# Patient Record
Sex: Male | Born: 1940 | Hispanic: No | Marital: Single | Smoking: Former smoker
Health system: Southern US, Community
[De-identification: ages and names within clinical notes are randomized; demographics above are authoritative.]

## PROBLEM LIST (undated history)

## (undated) DIAGNOSIS — I639 Cerebral infarction, unspecified: Secondary | ICD-10-CM

## (undated) DIAGNOSIS — C9110 Chronic lymphocytic leukemia of B-cell type not having achieved remission: Secondary | ICD-10-CM

## (undated) HISTORY — DX: Chronic lymphocytic leukemia of B-cell type not having achieved remission: C91.10

---

## 2004-10-02 ENCOUNTER — Ambulatory Visit: Payer: Self-pay | Admitting: Oncology

## 2005-02-09 ENCOUNTER — Ambulatory Visit: Payer: Self-pay | Admitting: Oncology

## 2005-07-30 ENCOUNTER — Ambulatory Visit: Payer: Self-pay | Admitting: Oncology

## 2006-01-23 ENCOUNTER — Ambulatory Visit: Payer: Self-pay | Admitting: Oncology

## 2006-02-12 LAB — CBC WITH DIFFERENTIAL/PLATELET
Basophils Absolute: 0.2 10*3/uL — ABNORMAL HIGH (ref 0.0–0.1)
EOS%: 0.4 % (ref 0.0–7.0)
Eosinophils Absolute: 0.5 10*3/uL (ref 0.0–0.5)
HGB: 13.1 g/dL (ref 13.0–17.1)
MCH: 28.9 pg (ref 28.0–33.4)
NEUT#: 3.9 10*3/uL (ref 1.5–6.5)
RBC: 4.53 10*6/uL (ref 4.20–5.71)
RDW: 14.8 % — ABNORMAL HIGH (ref 11.2–14.6)
lymph#: 101.2 10*3/uL — ABNORMAL HIGH (ref 0.9–3.3)

## 2006-08-08 ENCOUNTER — Ambulatory Visit: Payer: Self-pay | Admitting: Oncology

## 2006-09-25 ENCOUNTER — Ambulatory Visit: Payer: Self-pay | Admitting: Oncology

## 2006-09-26 LAB — CBC WITH DIFFERENTIAL/PLATELET
BASO%: 0.1 % (ref 0.0–2.0)
EOS%: 0.2 % (ref 0.0–7.0)
HCT: 42.4 % (ref 38.7–49.9)
MCH: 29.4 pg (ref 28.0–33.4)
MCHC: 33.6 g/dL (ref 32.0–35.9)
MCV: 87.5 fL (ref 81.6–98.0)
MONO%: 2.1 % (ref 0.0–13.0)
NEUT%: 5 % — ABNORMAL LOW (ref 40.0–75.0)
RDW: 14.6 % (ref 11.2–14.6)
lymph#: 93.9 10*3/uL — ABNORMAL HIGH (ref 0.9–3.3)

## 2007-03-21 ENCOUNTER — Ambulatory Visit: Payer: Self-pay | Admitting: Oncology

## 2007-03-25 LAB — CBC WITH DIFFERENTIAL/PLATELET
BASO%: 0.2 % (ref 0.0–2.0)
Basophils Absolute: 0.2 10*3/uL — ABNORMAL HIGH (ref 0.0–0.1)
EOS%: 0.6 % (ref 0.0–7.0)
HGB: 14 g/dL (ref 13.0–17.1)
MCH: 29 pg (ref 28.0–33.4)
MONO%: 2.1 % (ref 0.0–13.0)
RBC: 4.82 10*6/uL (ref 4.20–5.71)
RDW: 15 % — ABNORMAL HIGH (ref 11.2–14.6)
lymph#: 87.6 10*3/uL — ABNORMAL HIGH (ref 0.9–3.3)

## 2007-03-25 LAB — COMPREHENSIVE METABOLIC PANEL
ALT: 19 U/L (ref 0–53)
AST: 16 U/L (ref 0–37)
Alkaline Phosphatase: 77 U/L (ref 39–117)
BUN: 22 mg/dL (ref 6–23)
Chloride: 103 mEq/L (ref 96–112)
Creatinine, Ser: 0.81 mg/dL (ref 0.40–1.50)
Total Bilirubin: 0.8 mg/dL (ref 0.3–1.2)

## 2007-03-25 LAB — RETICULOCYTES: RBC: UNDETERMINED 10*6/uL (ref 4.20–5.71)

## 2007-03-25 LAB — LIPID PANEL
HDL: 25 mg/dL — ABNORMAL LOW (ref >39)
Triglycerides: 204 mg/dL — ABNORMAL HIGH (ref <150)

## 2007-03-25 LAB — RETICULOCYTES (CHCC): Retic Ct Pct: 0.7 % (ref 0.4–3.1)

## 2007-03-25 LAB — PSA, MEDICARE: PSA: 0.64 ng/mL (ref 0.10–4.00)

## 2007-03-25 LAB — TSH: TSH: 2.467 u[IU]/mL (ref 0.350–5.500)

## 2007-09-30 ENCOUNTER — Ambulatory Visit: Payer: Self-pay | Admitting: Oncology

## 2008-03-30 ENCOUNTER — Ambulatory Visit: Payer: Self-pay | Admitting: Oncology

## 2008-04-01 LAB — CBC WITH DIFFERENTIAL/PLATELET
BASO%: 0.2 % (ref 0.0–2.0)
EOS%: 0.3 % (ref 0.0–7.0)
MCHC: 32.5 g/dL (ref 32.0–35.9)
MONO#: 0.8 10*3/uL (ref 0.1–0.9)
RBC: 4.76 10*6/uL (ref 4.20–5.71)
WBC: 71.7 10*3/uL (ref 4.0–10.0)
lymph#: 67.4 10*3/uL — ABNORMAL HIGH (ref 0.9–3.3)

## 2008-04-01 LAB — TECHNOLOGIST REVIEW

## 2008-09-24 ENCOUNTER — Ambulatory Visit: Payer: Self-pay | Admitting: Oncology

## 2008-09-28 LAB — CBC WITH DIFFERENTIAL/PLATELET
BASO%: 0.2 % (ref 0.0–2.0)
EOS%: 0.4 % (ref 0.0–7.0)
MCH: 28.8 pg (ref 27.2–33.4)
MCHC: 32.5 g/dL (ref 32.0–36.0)
MCV: 88.3 fL (ref 79.3–98.0)
MONO%: 0.9 % (ref 0.0–14.0)
NEUT#: 2.2 10*3/uL (ref 1.5–6.5)
RBC: 4.8 10*6/uL (ref 4.20–5.82)
RDW: 14.6 % (ref 11.0–14.6)
nRBC: 0 % (ref 0–0)

## 2009-03-31 ENCOUNTER — Ambulatory Visit: Payer: Self-pay | Admitting: Oncology

## 2009-04-05 LAB — CBC WITH DIFFERENTIAL/PLATELET
Basophils Absolute: 0.1 10*3/uL (ref 0.0–0.1)
EOS%: 0.3 % (ref 0.0–7.0)
HGB: 13.9 g/dL (ref 13.0–17.1)
LYMPH%: 93.1 % — ABNORMAL HIGH (ref 14.0–49.0)
MCH: 29.1 pg (ref 27.2–33.4)
MCV: 87.7 fL (ref 79.3–98.0)
MONO%: 1.8 % (ref 0.0–14.0)
RDW: 14.7 % — ABNORMAL HIGH (ref 11.0–14.6)

## 2009-10-18 ENCOUNTER — Ambulatory Visit: Payer: Self-pay | Admitting: Oncology

## 2009-10-20 LAB — CBC WITH DIFFERENTIAL/PLATELET
Basophils Absolute: 0 10*3/uL (ref 0.0–0.1)
EOS%: 0.4 % (ref 0.0–7.0)
HCT: 38.8 % (ref 38.4–49.9)
HGB: 13.1 g/dL (ref 13.0–17.1)
MCH: 31.1 pg (ref 27.2–33.4)
MCV: 92.3 fL (ref 79.3–98.0)
MONO%: 6.8 % (ref 0.0–14.0)
NEUT%: 5.4 % — ABNORMAL LOW (ref 39.0–75.0)
lymph#: 40.2 10*3/uL — ABNORMAL HIGH (ref 0.9–3.3)

## 2010-06-29 ENCOUNTER — Ambulatory Visit: Payer: Self-pay | Admitting: Oncology

## 2011-03-05 ENCOUNTER — Encounter (HOSPITAL_BASED_OUTPATIENT_CLINIC_OR_DEPARTMENT_OTHER): Payer: Medicare Other | Admitting: Oncology

## 2011-03-05 ENCOUNTER — Other Ambulatory Visit: Payer: Self-pay | Admitting: Oncology

## 2011-03-05 DIAGNOSIS — Z23 Encounter for immunization: Secondary | ICD-10-CM

## 2011-03-05 DIAGNOSIS — C9110 Chronic lymphocytic leukemia of B-cell type not having achieved remission: Secondary | ICD-10-CM

## 2011-03-05 LAB — CBC WITH DIFFERENTIAL/PLATELET
Eosinophils Absolute: 0.2 10*3/uL (ref 0.0–0.5)
LYMPH%: 94.6 % — ABNORMAL HIGH (ref 14.0–49.0)
MONO#: 0.5 10*3/uL (ref 0.1–0.9)
NEUT#: 2.1 10*3/uL (ref 1.5–6.5)
Platelets: 84 10*3/uL — ABNORMAL LOW (ref 140–400)
RBC: 4.6 10*6/uL (ref 4.20–5.82)
RDW: 14.3 % (ref 11.0–14.6)
WBC: 54.7 10*3/uL (ref 4.0–10.3)

## 2011-03-05 LAB — TECHNOLOGIST REVIEW

## 2011-06-07 ENCOUNTER — Telehealth: Payer: Self-pay | Admitting: *Deleted

## 2011-06-07 NOTE — Telephone Encounter (Signed)
Pt reports he was instructed to begin Aspirin for "cardiac blockage". Was also given blood pressure medicine. Has not started taking medications yet, wanted to be sure this is OK with Dr. Truett Perna. 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 Being scheduled for cardiac cath. He reports cardiologist at [facility] is aware of his platelet count.

## 2011-06-07 NOTE — Telephone Encounter (Signed)
Ok to take aspirin

## 2011-06-08 ENCOUNTER — Telehealth: Payer: Self-pay | Admitting: *Deleted

## 2011-06-08 NOTE — Telephone Encounter (Signed)
Per Dr. Truett Perna, OK to begin aspirin therapy under direction of cardiologist. Called pt, he verbalized understanding. Informed him that schedulers will call him with his yearly follow up appt due in August.

## 2011-07-10 HISTORY — PX: VEIN BYPASS SURGERY: SHX833

## 2011-10-05 ENCOUNTER — Telehealth: Payer: Self-pay | Admitting: Oncology

## 2011-10-05 NOTE — Telephone Encounter (Signed)
called pt and provided appts for aug2013 to wife

## 2012-02-08 ENCOUNTER — Telehealth: Payer: Self-pay | Admitting: Oncology

## 2012-02-08 NOTE — Telephone Encounter (Signed)
Returned pt's call and r/s 8/29 appt to 9/23. Date per pt.

## 2012-03-06 ENCOUNTER — Ambulatory Visit: Payer: Medicare Other | Admitting: Oncology

## 2012-03-06 ENCOUNTER — Other Ambulatory Visit: Payer: Medicare Other

## 2012-03-28 ENCOUNTER — Encounter: Payer: Self-pay | Admitting: *Deleted

## 2012-03-31 ENCOUNTER — Telehealth: Payer: Self-pay | Admitting: Oncology

## 2012-03-31 ENCOUNTER — Ambulatory Visit (HOSPITAL_BASED_OUTPATIENT_CLINIC_OR_DEPARTMENT_OTHER): Payer: Medicare Other | Admitting: Oncology

## 2012-03-31 ENCOUNTER — Other Ambulatory Visit (HOSPITAL_BASED_OUTPATIENT_CLINIC_OR_DEPARTMENT_OTHER): Payer: Medicare Other | Admitting: Lab

## 2012-03-31 VITALS — BP 147/81 | HR 61 | Temp 97.7°F | Resp 18 | Ht 69.0 in | Wt 182.1 lb

## 2012-03-31 DIAGNOSIS — C9110 Chronic lymphocytic leukemia of B-cell type not having achieved remission: Secondary | ICD-10-CM

## 2012-03-31 DIAGNOSIS — E119 Type 2 diabetes mellitus without complications: Secondary | ICD-10-CM

## 2012-03-31 DIAGNOSIS — D649 Anemia, unspecified: Secondary | ICD-10-CM

## 2012-03-31 DIAGNOSIS — Z951 Presence of aortocoronary bypass graft: Secondary | ICD-10-CM

## 2012-03-31 LAB — CBC WITH DIFFERENTIAL/PLATELET
BASO%: 0.2 % (ref 0.0–2.0)
EOS%: 0.6 % (ref 0.0–7.0)
HCT: 38.7 % (ref 38.4–49.9)
LYMPH%: 91.3 % — ABNORMAL HIGH (ref 14.0–49.0)
MCH: 30.5 pg (ref 27.2–33.4)
MCHC: 32.8 g/dL (ref 32.0–36.0)
MONO%: 1.8 % (ref 0.0–14.0)
NEUT%: 6.1 % — ABNORMAL LOW (ref 39.0–75.0)
Platelets: 73 10*3/uL — ABNORMAL LOW (ref 140–400)

## 2012-03-31 LAB — TECHNOLOGIST REVIEW

## 2012-03-31 NOTE — Progress Notes (Signed)
   [facility]    OFFICE PROGRESS NOTE   INTERVAL HISTORY:   He returns as scheduled. He had a coronary artery bypass surgery in March of this year. He feels well. No fever, night sweats, or palpable lymph nodes. No complaint.  Objective:  Vital signs in last 24 hours:  Blood pressure 147/81, pulse 61, temperature 97.7 F (36.5 C), temperature source Oral, resp. rate 18, height 5\' 9"  (1.753 m), weight 182 lb 1.6 oz (82.6 kg).    HEENT: Neck without mass Lymphatics: No cervical, supraclavicular, axillary, or inguinal nodes Resp: Lungs clear bilateral Cardio: Regular rate and rhythm GI: No hepatosplenomegaly Vascular: No leg edema   Lab Results:  Lab Results  Component Value Date   WBC 37.8* 03/31/2012   HGB 12.7* 03/31/2012   HCT 38.7 03/31/2012   MCV 92.8 03/31/2012   PLT 73* 03/31/2012   ANC 2.4   Medications: I have reviewed the patient's current medications.  Assessment/Plan: 1. Chronic lymphocytic leukemia, mild anemia, otherwise stable from a hematologic standpoint and asymptomatic.  The plan is to continue an observation approach. 2. Diabetes. 3.     Coronary artery bypass surgery in March of 2013   Disposition:  He remains asymptomatic from the CLL. No indication for treatment at present. He will return for a followup CBC in 3 months. He is scheduled for a one-year of his visit. He last received a pneumococcal vaccine in September of 2010. He will stay up-to-date on the influenza vaccine.   Thornton Papas, MD  03/31/2012  1:47 PM

## 2012-03-31 NOTE — Telephone Encounter (Signed)
Gave pt appt for December 2013 lab only then see MD in September 2014 with lab

## 2012-07-04 ENCOUNTER — Other Ambulatory Visit (HOSPITAL_BASED_OUTPATIENT_CLINIC_OR_DEPARTMENT_OTHER): Payer: Medicare Other | Admitting: Lab

## 2012-07-04 DIAGNOSIS — C9110 Chronic lymphocytic leukemia of B-cell type not having achieved remission: Secondary | ICD-10-CM

## 2012-07-04 LAB — TECHNOLOGIST REVIEW

## 2012-07-04 LAB — CBC WITH DIFFERENTIAL/PLATELET
BASO%: 0.1 % (ref 0.0–2.0)
Basophils Absolute: 0 10*3/uL (ref 0.0–0.1)
EOS%: 0.8 % (ref 0.0–7.0)
HGB: 12.3 g/dL — ABNORMAL LOW (ref 13.0–17.1)
MCH: 29.7 pg (ref 27.2–33.4)
MONO%: 2.2 % (ref 0.0–14.0)
RBC: 4.14 10*6/uL — ABNORMAL LOW (ref 4.20–5.82)
RDW: 13.6 % (ref 11.0–14.6)
lymph#: 36.1 10*3/uL — ABNORMAL HIGH (ref 0.9–3.3)
nRBC: 0 % (ref 0–0)

## 2012-07-05 ENCOUNTER — Telehealth: Payer: Self-pay | Admitting: *Deleted

## 2012-07-05 NOTE — Telephone Encounter (Signed)
Left message at home number to have pt call office for lab results. (Hgb and PLT stable, per Dr. Truett Perna. Follow up as scheduled.

## 2012-07-05 NOTE — Telephone Encounter (Signed)
Message copied by Caleb Popp on Sat Jul 05, 2012  2:23 PM ------      Message from: Ladene Artist      Created: Sat Jul 05, 2012  9:00 AM       Please call patientk hb and plts are stable, f/u as scheduled

## 2013-04-07 ENCOUNTER — Other Ambulatory Visit: Payer: Self-pay | Admitting: *Deleted

## 2013-04-07 DIAGNOSIS — C9110 Chronic lymphocytic leukemia of B-cell type not having achieved remission: Secondary | ICD-10-CM

## 2013-04-08 ENCOUNTER — Other Ambulatory Visit: Payer: Medicare Other | Admitting: Lab

## 2013-04-09 ENCOUNTER — Telehealth: Payer: Self-pay | Admitting: Oncology

## 2013-04-09 ENCOUNTER — Ambulatory Visit (HOSPITAL_BASED_OUTPATIENT_CLINIC_OR_DEPARTMENT_OTHER): Payer: Medicare Other | Admitting: Oncology

## 2013-04-09 ENCOUNTER — Other Ambulatory Visit (HOSPITAL_BASED_OUTPATIENT_CLINIC_OR_DEPARTMENT_OTHER): Payer: Medicare Other | Admitting: Lab

## 2013-04-09 VITALS — BP 128/65 | HR 67 | Temp 97.0°F | Resp 18 | Ht 69.0 in | Wt 171.0 lb

## 2013-04-09 DIAGNOSIS — C9210 Chronic myeloid leukemia, BCR/ABL-positive, not having achieved remission: Secondary | ICD-10-CM

## 2013-04-09 DIAGNOSIS — L0591 Pilonidal cyst without abscess: Secondary | ICD-10-CM

## 2013-04-09 DIAGNOSIS — C9110 Chronic lymphocytic leukemia of B-cell type not having achieved remission: Secondary | ICD-10-CM

## 2013-04-09 LAB — CBC WITH DIFFERENTIAL/PLATELET
BASO%: 0.1 % (ref 0.0–2.0)
EOS%: 0.5 % (ref 0.0–7.0)
MCH: 28.9 pg (ref 27.2–33.4)
MCHC: 33 g/dL (ref 32.0–36.0)
MONO#: 1 10*3/uL — ABNORMAL HIGH (ref 0.1–0.9)
RBC: 4.53 10*6/uL (ref 4.20–5.82)
RDW: 13.9 % (ref 11.0–14.6)
WBC: 42.7 10*3/uL — ABNORMAL HIGH (ref 4.0–10.3)
lymph#: 38.7 10*3/uL — ABNORMAL HIGH (ref 0.9–3.3)

## 2013-04-09 LAB — TECHNOLOGIST REVIEW

## 2013-04-09 MED ORDER — MUPIROCIN 2 % EX OINT: TOPICAL_OINTMENT | Freq: Two times a day (BID) | CUTANEOUS | Status: DC

## 2013-04-09 NOTE — Progress Notes (Signed)
    [facility]    OFFICE PROGRESS NOTE   INTERVAL HISTORY:   He returns for scheduled followup of chronic lymphocytic leukemia. No fever or night sweats. Good appetite. He is walking daily.  He reports a skin infection at the upper gluteal fold that has improved with "Bactroban "appointment. He has been taking this for the past several days and requests a refill.  Objective:  Vital signs in last 24 hours:  Blood pressure 128/65, pulse 67, temperature 97 F (36.1 C), temperature source Oral, resp. rate 18, height 5\' 9"  (1.753 m), weight 171 lb (77.565 kg).    HEENT: Neck without mass Lymphatics: No cervical, supraclavicular, or inguinal nodes. Prominent bilateral axillary fat pads,? 1 cm right axillary node Resp: Lungs clear bilaterally Cardio: Regular rate and rhythm GI: No hepatosplenomegaly Vascular: No leg edema  Skin: At the upper gluteal fold there is an area erythema and mild induration to the left of midline. No fluctuance or drainage.     Lab Results:  Lab Results  Component Value Date   WBC 42.7* 04/09/2013   HGB 13.1 04/09/2013   HCT 39.7 04/09/2013   MCV 87.7 04/09/2013   PLT 91* 04/09/2013   ANC 2.7    Medications: I have reviewed the patient's current medications.  Assessment/Plan: 1. Chronic lymphocytic leukemia, mild anemia/thrombocytopenia, stable from a hematologic standpoint and asymptomatic. The plan is to continue an observation approach. 2. Diabetes.       3. Coronary artery bypass surgery in March of 2013        4. 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 We refilled Bactroban to take for the next week.. I recommended he seek medical attention if this lesion does not resolve.   Disposition:  He remains asymptomatic from the CLL. The plan is to continue an observation approach. He will obtain an influenza vaccine at home. He will be due for a pneumococcal vaccine in 2015. He last  received a pneumococcal vaccine in September of 2010.  Mr. Eidem will return for an office visit in one year.   Thornton Papas, MD  04/09/2013  1:43 PM

## 2013-04-09 NOTE — Telephone Encounter (Signed)
gv and printed appt sched and avs for pt for OCT 2015  °

## 2014-03-22 ENCOUNTER — Telehealth: Payer: Self-pay | Admitting: Oncology

## 2014-03-22 NOTE — Telephone Encounter (Signed)
s/w pt re new appt for 10/8 - moved from 10/2.

## 2014-04-09 ENCOUNTER — Other Ambulatory Visit: Payer: Medicare Other

## 2014-04-09 ENCOUNTER — Ambulatory Visit: Payer: Medicare Other | Admitting: Oncology

## 2014-04-14 ENCOUNTER — Other Ambulatory Visit: Payer: Self-pay | Admitting: *Deleted

## 2014-04-14 DIAGNOSIS — C9210 Chronic myeloid leukemia, BCR/ABL-positive, not having achieved remission: Secondary | ICD-10-CM

## 2014-04-15 ENCOUNTER — Other Ambulatory Visit (HOSPITAL_BASED_OUTPATIENT_CLINIC_OR_DEPARTMENT_OTHER): Payer: Medicare Other

## 2014-04-15 ENCOUNTER — Ambulatory Visit (HOSPITAL_BASED_OUTPATIENT_CLINIC_OR_DEPARTMENT_OTHER): Payer: Medicare Other | Admitting: Oncology

## 2014-04-15 ENCOUNTER — Encounter: Payer: Self-pay | Admitting: *Deleted

## 2014-04-15 ENCOUNTER — Telehealth: Payer: Self-pay | Admitting: Oncology

## 2014-04-15 VITALS — BP 141/71 | HR 62 | Temp 97.7°F | Resp 18 | Ht 69.0 in | Wt 169.2 lb

## 2014-04-15 DIAGNOSIS — C9110 Chronic lymphocytic leukemia of B-cell type not having achieved remission: Secondary | ICD-10-CM | POA: Insufficient documentation

## 2014-04-15 DIAGNOSIS — D649 Anemia, unspecified: Secondary | ICD-10-CM

## 2014-04-15 DIAGNOSIS — D696 Thrombocytopenia, unspecified: Secondary | ICD-10-CM

## 2014-04-15 DIAGNOSIS — C9210 Chronic myeloid leukemia, BCR/ABL-positive, not having achieved remission: Secondary | ICD-10-CM

## 2014-04-15 DIAGNOSIS — Z23 Encounter for immunization: Secondary | ICD-10-CM

## 2014-04-15 LAB — CBC WITH DIFFERENTIAL/PLATELET
BASO%: 0.2 % (ref 0.0–2.0)
Basophils Absolute: 0.1 10*3/uL (ref 0.0–0.1)
EOS%: 1 % (ref 0.0–7.0)
Eosinophils Absolute: 0.4 10*3/uL (ref 0.0–0.5)
HEMATOCRIT: 39.6 % (ref 38.4–49.9)
HGB: 12.4 g/dL — ABNORMAL LOW (ref 13.0–17.1)
LYMPH%: 90.3 % — AB (ref 14.0–49.0)
MCH: 27.4 pg (ref 27.2–33.4)
MCHC: 31.3 g/dL — AB (ref 32.0–36.0)
MCV: 87.6 fL (ref 79.3–98.0)
MONO#: 0.8 10*3/uL (ref 0.1–0.9)
MONO%: 1.9 % (ref 0.0–14.0)
NEUT#: 2.7 10*3/uL (ref 1.5–6.5)
NEUT%: 6.6 % — AB (ref 39.0–75.0)
PLATELETS: 106 10*3/uL — AB (ref 140–400)
RBC: 4.52 10*6/uL (ref 4.20–5.82)
RDW: 17 % — ABNORMAL HIGH (ref 11.0–14.6)
WBC: 40.8 10*3/uL — AB (ref 4.0–10.3)
lymph#: 36.9 10*3/uL — ABNORMAL HIGH (ref 0.9–3.3)

## 2014-04-15 LAB — TECHNOLOGIST REVIEW

## 2014-04-15 MED ORDER — PNEUMOCOCCAL 13-VAL CONJ VACC IM SUSP
0.5000 mL | Freq: Once | INTRAMUSCULAR | Status: AC
  Administered 2014-04-15: 0.5 mL via INTRAMUSCULAR
  Filled 2014-04-15: qty 0.5

## 2014-04-15 NOTE — Addendum Note (Signed)
Addended by: Brien Few on: 04/15/2014 12:48 PM   Modules accepted: Orders

## 2014-04-15 NOTE — Progress Notes (Signed)
This RN faxed Dr. Gearldine Shown last office note, 04/15/2014, to Dr. Gaetana Michaelis. Fax # is [phone] and office # is [phone]. Received faxed confirmation.

## 2014-04-15 NOTE — Progress Notes (Signed)
  [facility] OFFICE PROGRESS NOTE   Diagnosis: CLL  INTERVAL HISTORY:   He returns as scheduled. He feels well. No fever, night sweats, or anorexia. He reports difficulty with balance when he is walking. He had a recent urinary tract infection. No other infections. He has received an influenza vaccine.  Objective:  Vital signs in last 24 hours:  Blood pressure 141/71, pulse 62, temperature 97.7 F (36.5 C), temperature source Oral, resp. rate 18, height 5\' 9"  (1.753 m), weight 169 lb 3.2 oz (76.749 kg), SpO2 99.00%.    HEENT: Neck without mass Lymphatics: No cervical, supraclavicular, left axillary, or inguinal nodes. Prominent right axillary fat pad versus a soft 1-2 cm node Resp: Lungs clear bilaterally Cardio: Regular rate and rhythm GI: No hepatosplenomegaly Vascular: No leg edema   Lab Results:  Lab Results  Component Value Date   WBC 40.8* 04/15/2014   HGB 12.4* 04/15/2014   HCT 39.6 04/15/2014   MCV 87.6 04/15/2014   PLT 106* 04/15/2014   NEUTROABS 2.7 04/15/2014   absolute lymphocyte count 36.9   Medications: I have reviewed the patient's current medications.  Assessment/Plan: 1. Chronic lymphocytic leukemia, mild anemia/thrombocytopenia, stable from a hematologic standpoint and asymptomatic. The plan is to continue an observation approach. 2. Diabetes.       3. Coronary artery bypass surgery in March of 2013     Disposition:  He remains asymptomatic from the CLL and stable from a hematologic standpoint. No indication for treating the CLL. We administered a 13 valent pneumococcal vaccine today. Mr. Kuang will return for an office visit in one year.  Betsy Coder, MD  04/15/2014  12:40 PM

## 2014-04-15 NOTE — Telephone Encounter (Signed)
gv adn printed appt scheda nd avs for pt for NOV.Nathaniel KitchenMarland Kitchenpt wanted appt in NOV

## 2015-04-14 ENCOUNTER — Other Ambulatory Visit: Payer: Medicare Other

## 2015-04-14 ENCOUNTER — Ambulatory Visit: Payer: Medicare Other | Admitting: Oncology

## 2015-05-25 ENCOUNTER — Telehealth: Payer: Self-pay | Admitting: Oncology

## 2015-05-25 NOTE — Telephone Encounter (Signed)
Due to PAL moved 11/25 appointments to 11/23 per BS. Spoke with patient and mailed schedule.

## 2015-06-01 ENCOUNTER — Ambulatory Visit (HOSPITAL_BASED_OUTPATIENT_CLINIC_OR_DEPARTMENT_OTHER): Payer: Medicare Other | Admitting: Oncology

## 2015-06-01 ENCOUNTER — Telehealth: Payer: Self-pay | Admitting: Oncology

## 2015-06-01 ENCOUNTER — Other Ambulatory Visit: Payer: Self-pay | Admitting: *Deleted

## 2015-06-01 ENCOUNTER — Other Ambulatory Visit (HOSPITAL_BASED_OUTPATIENT_CLINIC_OR_DEPARTMENT_OTHER): Payer: Medicare Other

## 2015-06-01 VITALS — BP 156/80 | HR 71 | Temp 97.1°F | Resp 17 | Ht 69.0 in | Wt 177.2 lb

## 2015-06-01 DIAGNOSIS — Z23 Encounter for immunization: Secondary | ICD-10-CM | POA: Diagnosis not present

## 2015-06-01 DIAGNOSIS — C9110 Chronic lymphocytic leukemia of B-cell type not having achieved remission: Secondary | ICD-10-CM | POA: Diagnosis not present

## 2015-06-01 LAB — CBC WITH DIFFERENTIAL/PLATELET
BASO%: 0.1 % (ref 0.0–2.0)
BASOS ABS: 0 10*3/uL (ref 0.0–0.1)
EOS ABS: 0.2 10*3/uL (ref 0.0–0.5)
EOS%: 0.6 % (ref 0.0–7.0)
HEMATOCRIT: 43.1 % (ref 38.4–49.9)
HGB: 14.1 g/dL (ref 13.0–17.1)
LYMPH%: 92.3 % — ABNORMAL HIGH (ref 14.0–49.0)
MCH: 28.7 pg (ref 27.2–33.4)
MCHC: 32.8 g/dL (ref 32.0–36.0)
MCV: 87.5 fL (ref 79.3–98.0)
MONO#: 0.7 10*3/uL (ref 0.1–0.9)
MONO%: 1.6 % (ref 0.0–14.0)
NEUT%: 5.4 % — ABNORMAL LOW (ref 39.0–75.0)
NEUTROS ABS: 2.4 10*3/uL (ref 1.5–6.5)
Platelets: 88 10*3/uL — ABNORMAL LOW (ref 140–400)
RBC: 4.92 10*6/uL (ref 4.20–5.82)
RDW: 14.4 % (ref 11.0–14.6)
WBC: 44.2 10*3/uL — AB (ref 4.0–10.3)
lymph#: 40.8 10*3/uL — ABNORMAL HIGH (ref 0.9–3.3)

## 2015-06-01 LAB — TECHNOLOGIST REVIEW

## 2015-06-01 MED ORDER — INFLUENZA VAC SPLIT QUAD 0.5 ML IM SUSY
0.5000 mL | PREFILLED_SYRINGE | Freq: Once | INTRAMUSCULAR | Status: AC
Start: 2015-06-01 — End: 2015-06-01
  Administered 2015-06-01: 0.5 mL via INTRAMUSCULAR
  Filled 2015-06-01: qty 0.5

## 2015-06-01 MED ORDER — PNEUMOCOCCAL VAC POLYVALENT 25 MCG/0.5ML IJ INJ
0.5000 mL | INJECTION | Freq: Once | INTRAMUSCULAR | Status: AC
  Administered 2015-06-01: 0.5 mL via INTRAMUSCULAR
  Filled 2015-06-01: qty 0.5

## 2015-06-01 NOTE — Progress Notes (Signed)
  [facility] OFFICE PROGRESS NOTE   Diagnosis: CLL  INTERVAL HISTORY:   He returns as scheduled. He feels well. No recent infection. No fever or night sweats. He reports hoarseness when he smokes cigars. He reports difficulty with balance for the past year.  Objective:  Vital signs in last 24 hours:  Blood pressure 156/80, pulse 71, temperature 97.1 F (36.2 C), temperature source Oral, resp. rate 17, height 5\' 9"  (1.753 m), weight 177 lb 3.2 oz (80.377 kg), SpO2 99 %.    HEENT: Neck without mass, mild right conjunctival erythema Lymphatics: No cervical, supra-clavicular, axillary, or inguinal nodes Resp: Lungs clear bilaterally Cardio: Regular rate and rhythm GI: No hepatomegaly, the spleen tip is palpable in the left subcostal region Vascular: No leg edema   Lab Results:  Lab Results  Component Value Date   WBC 44.2* 06/01/2015   HGB 14.1 06/01/2015   HCT 43.1 06/01/2015   MCV 87.5 06/01/2015   PLT 88* 06/01/2015   NEUTROABS 2.4 06/01/2015   Absolute lymphocyte count 40.8  Medications: I have reviewed the patient's current medications.  Assessment/Plan: 1. Chronic lymphocytic leukemia, mild anemia/thrombocytopenia, stable from a hematologic standpoint and asymptomatic. The plan is to continue an observation approach. 2. Diabetes.  3.  Coronary artery bypass surgery in March of 2013    Disposition:  He last had a 23 valent pneumonia vaccine in September 2010. He received a 13 valent pneumonia vaccine last year. We will administer the 23 valent pneumococcal vaccine today and the influenza vaccine.  He will return for an office and lab visit in one year. He will seek medical attention for symptoms of an infection or if the right I erythema does not improve.  Betsy Coder, MD  06/01/2015  11:10 AM

## 2015-06-01 NOTE — Telephone Encounter (Signed)
per pof to sch pt appt-gave pt copy of avs °

## 2015-06-03 ENCOUNTER — Ambulatory Visit: Payer: Medicare Other | Admitting: Oncology

## 2015-06-03 ENCOUNTER — Other Ambulatory Visit: Payer: Medicare Other

## 2015-07-25 ENCOUNTER — Telehealth: Payer: Self-pay | Admitting: Oncology

## 2015-07-25 ENCOUNTER — Telehealth: Payer: Self-pay | Admitting: *Deleted

## 2015-07-25 NOTE — Telephone Encounter (Signed)
Pt called and requested labs be sent to Dr Gaetana Michaelis. Labs faxed

## 2015-07-25 NOTE — Telephone Encounter (Signed)
FAX LABS AND RECENT NOTE PER PT REQUEST TO PCP IN [place] Q[place]:[id number] RELEASE ID

## 2015-07-25 NOTE — Telephone Encounter (Signed)
"  We need a copy of lab work done December 2016. Faxed to PCP in [place].  I can give you the fax number."  Call transferred to H.I.M. Representative for Dr. Benay Spice.

## 2016-05-28 ENCOUNTER — Telehealth: Payer: Self-pay | Admitting: *Deleted

## 2016-05-28 NOTE — Telephone Encounter (Signed)
FYI "I'm in [place] and need to cancel the appointment for Monday 06-04-2016.  What number do I call to reschedule? Instructed to call office number pressing option 2 to reschedule. Scheduling message sent

## 2016-06-04 ENCOUNTER — Other Ambulatory Visit: Payer: Self-pay

## 2016-06-04 ENCOUNTER — Ambulatory Visit: Payer: Self-pay | Admitting: Oncology

## 2017-02-20 ENCOUNTER — Encounter: Payer: Self-pay | Admitting: Nurse Practitioner

## 2017-02-20 ENCOUNTER — Ambulatory Visit (INDEPENDENT_AMBULATORY_CARE_PROVIDER_SITE_OTHER): Payer: Medicare Other | Admitting: Nurse Practitioner

## 2017-02-20 ENCOUNTER — Encounter (INDEPENDENT_AMBULATORY_CARE_PROVIDER_SITE_OTHER): Payer: Self-pay

## 2017-02-20 VITALS — BP 111/67 | HR 85 | Temp 97.8°F | Ht 69.0 in | Wt 164.0 lb

## 2017-02-20 DIAGNOSIS — L03113 Cellulitis of right upper limb: Secondary | ICD-10-CM | POA: Diagnosis not present

## 2017-02-20 MED ORDER — CIPROFLOXACIN HCL 500 MG PO TABS: 500.0000 mg | ORAL_TABLET | Freq: Two times a day (BID) | ORAL | 0 refills | Status: DC

## 2017-02-20 NOTE — Patient Instructions (Signed)

## 2017-02-20 NOTE — Progress Notes (Signed)
   Subjective:    Patient ID: Nathaniel Burgess, male    DOB: 26-Oct-1940, 76 y.o.   MRN: [id number]  HPI Patient brought in by his daughter and son to be seen for red swollen right forearm. He had a stroke several months ago and has limited mobility. He does not think his mobility is limited and he decided to go walking in the dark 2 night sago and fell causing some superficial abrasions to right forearm. Daughter  Lattie Haw noticed this morning that right forearm was red and swollen and hot to touch.    Review of Systems  Constitutional: Negative.   HENT: Negative.   Respiratory: Negative.   Cardiovascular: Negative.   Genitourinary: Negative.   Neurological: Negative.   Psychiatric/Behavioral: Negative.        Objective:   Physical Exam  Constitutional: He appears well-developed and well-nourished. No distress.  Cardiovascular: Normal rate.   Pulmonary/Chest: Effort normal and breath sounds normal.  Musculoskeletal:  FROM of right wrist and forearm without pain.  Neurological: He is alert.  Skin: Skin is warm.  Right forearm erythematous hot and edematous.  Psychiatric: He has a normal mood and affect. His behavior is normal. Judgment and thought content normal.   BP 111/67   Pulse 85   Temp 97.8 F (36.6 C) (Oral)   Ht 5\' 9"  (1.753 m)   Wt 164 lb (74.4 kg)   BMI 24.22 kg/m         Assessment & Plan:   1. Cellulitis of right forearm    . ciprofloxacin (CIPRO) 500 MG tablet    Sig: Take 1 tablet (500 mg total) by mouth 2 (two) times daily.    Dispense:  20 tablet    Refill:  0    Order Specific Question:   Supervising Provider    Answer:   Eustaquio Maize [[id number]]   Cool compresses No picking or scratching RTO prn  Mary-Margaret Hassell Done, FNP

## 2017-03-02 ENCOUNTER — Encounter: Payer: Self-pay | Admitting: Physician Assistant

## 2017-03-02 ENCOUNTER — Ambulatory Visit (INDEPENDENT_AMBULATORY_CARE_PROVIDER_SITE_OTHER): Payer: Medicare Other | Admitting: Physician Assistant

## 2017-03-02 VITALS — BP 110/63 | HR 80 | Temp 97.3°F | Ht 69.0 in

## 2017-03-02 DIAGNOSIS — S51011A Laceration without foreign body of right elbow, initial encounter: Secondary | ICD-10-CM | POA: Diagnosis not present

## 2017-03-02 MED ORDER — CEPHALEXIN 500 MG PO CAPS: 500.0000 mg | ORAL_CAPSULE | Freq: Three times a day (TID) | ORAL | 0 refills | Status: DC

## 2017-03-02 NOTE — Progress Notes (Signed)
BP 110/63 (BP Location: Left Arm, Patient Position: Sitting, Cuff Size: Normal)   Pulse 80   Temp (!) 97.3 F (36.3 C) (Oral)   Ht 5\' 9"  (1.753 m)    Subjective:    Patient ID: Nathaniel Burgess, male    DOB: [birth year], 76 y.o.   MRN: [id number]  HPI: Nathaniel Burgess is a 76 y.o. male presenting on 03/02/2017 for Laceration (R elbow ) The patient is accompanied by his daughter this morning. He does have significant dementia and uses a walker to ambulate. She states that he fell this morning and bumped his elbow. There was a significant amount of bleeding. He does take an aspirin every day. He does not seem to be in any severe distress at this time. We will look at the wound and repair as needed  Relevant past medical, surgical, family and social history reviewed and updated as indicated. Allergies and medications reviewed and updated.  Past Medical History:  Diagnosis Date  . Chronic lymphocytic leukemia ([place])   . Diabetes mellitus     No past surgical history on file.  Review of Systems  Constitutional: Negative.  Negative for appetite change and fatigue.  Eyes: Negative for pain and visual disturbance.  Respiratory: Negative.  Negative for cough, chest tightness, shortness of breath and wheezing.   Cardiovascular: Negative.  Negative for chest pain, palpitations and leg swelling.  Gastrointestinal: Negative.  Negative for abdominal pain, diarrhea, nausea and vomiting.  Genitourinary: Negative.   Skin: Positive for wound. Negative for color change and rash.  Neurological: Negative.  Negative for weakness, numbness and headaches.  Psychiatric/Behavioral: Negative.     Allergies as of 03/02/2017      Reactions   Amoxicillin Rash      Medication List       Accurate as of 03/02/17  9:44 AM. Always use your most recent med list.          aspirin 325 MG EC tablet Take 325 mg by mouth daily.   atorvastatin 40 MG tablet Commonly known as:  LIPITOR Take 40 mg by mouth  daily.   cephALEXin 500 MG capsule Commonly known as:  KEFLEX Take 1 capsule (500 mg total) by mouth 3 (three) times daily.   glipiZIDE 5 MG tablet Commonly known as:  GLUCOTROL Take by mouth 2 (two) times daily before a meal.   memantine 5 MG tablet Commonly known as:  NAMENDA Take 5 mg by mouth 2 (two) times daily.   metoprolol tartrate 25 MG tablet Commonly known as:  LOPRESSOR Take 25 mg by mouth. Take 1/2 tab daily   Vitamin D3 1000 units Caps Take 1 capsule by mouth daily.            Discharge Care Instructions        Start     Ordered   03/02/17 0000  cephALEXin (KEFLEX) 500 MG capsule  3 times daily    Question:  Supervising Provider  Answer:  Timmothy Euler   03/02/17 0924         Objective:    BP 110/63 (BP Location: Left Arm, Patient Position: Sitting, Cuff Size: Normal)   Pulse 80   Temp (!) 97.3 F (36.3 C) (Oral)   Ht 5\' 9"  (1.753 m)   Allergies  Allergen Reactions  . Amoxicillin Rash    Physical Exam  Constitutional: He appears well-developed. No distress.  HENT:  Head: Normocephalic and atraumatic.  Eyes: Pupils are equal, round, and reactive to light.  Conjunctivae and EOM are normal.  Cardiovascular: Normal rate, regular rhythm and normal heart sounds.   Pulmonary/Chest: Effort normal and breath sounds normal. No respiratory distress.  Skin: Skin is warm and dry. Laceration noted. He is not diaphoretic.     2.5 cm laceration on the olecranon surface of the right elbow. Half of it is deep enough to require suture. The other half can be covered with Steri-Strips. It is more of a skin tear.  Psychiatric: He has a normal mood and affect. His behavior is normal.  Nursing note and vitals reviewed.  Laceration repair: Wound was irrigated with normal saline at pressure. 2% lidocaine with epinephrine was used for local anesthesia, 1 mL. 4-0 Monocryl was used to repair the wound.  Placed 3 sutures. Wound was approximated well and topical  antibiotic was used and then it was covered by 4 x 4 and tape told in place. Procedure was tolerated well.  Steri-Strips were attached to the second part of the wound. And all of that was wrapped and bandaged.    Assessment & Plan:   1. Laceration of right elbow, initial encounter - cephALEXin (KEFLEX) 500 MG capsule; Take 1 capsule (500 mg total) by mouth 3 (three) times daily.  Dispense: 30 capsule; Refill: 0    Current Outpatient Prescriptions:  .  aspirin 325 MG EC tablet, Take 325 mg by mouth daily., Disp: , Rfl:  .  atorvastatin (LIPITOR) 40 MG tablet, Take 40 mg by mouth daily., Disp: , Rfl:  .  Cholecalciferol (VITAMIN D3) 1000 UNITS CAPS, Take 1 capsule by mouth daily., Disp: , Rfl:  .  glipiZIDE (GLUCOTROL) 5 MG tablet, Take by mouth 2 (two) times daily before a meal., Disp: , Rfl:  .  memantine (NAMENDA) 5 MG tablet, Take 5 mg by mouth 2 (two) times daily., Disp: , Rfl:  .  metoprolol tartrate (LOPRESSOR) 25 MG tablet, Take 25 mg by mouth. Take 1/2 tab daily, Disp: , Rfl:  .  cephALEXin (KEFLEX) 500 MG capsule, Take 1 capsule (500 mg total) by mouth 3 (three) times daily., Disp: 30 capsule, Rfl: 0 Continue all other maintenance medications as listed above.  Follow up plan: Return in about 10 days (around 03/12/2017) for recheck.  Educational handout given for wound care  Terald Sleeper PA-C [place] [address]  [place], [place] [address] [phone]   03/02/2017, 9:44 AM

## 2017-03-02 NOTE — Patient Instructions (Signed)

## 2017-03-12 ENCOUNTER — Emergency Department (HOSPITAL_COMMUNITY): Payer: Medicare Other

## 2017-03-12 ENCOUNTER — Inpatient Hospital Stay (HOSPITAL_COMMUNITY): Payer: Medicare Other

## 2017-03-12 ENCOUNTER — Encounter (HOSPITAL_COMMUNITY): Payer: Self-pay

## 2017-03-12 ENCOUNTER — Inpatient Hospital Stay (HOSPITAL_COMMUNITY)
Admission: EM | Admit: 2017-03-12 | Discharge: 2017-03-15 | DRG: 064 | Disposition: A | Discharge status: Skilled Nursing Facility | Payer: Medicare Other | Attending: Family Medicine | Admitting: Family Medicine

## 2017-03-12 DIAGNOSIS — R531 Weakness: Secondary | ICD-10-CM

## 2017-03-12 DIAGNOSIS — Z7982 Long term (current) use of aspirin: Secondary | ICD-10-CM

## 2017-03-12 DIAGNOSIS — R4781 Slurred speech: Secondary | ICD-10-CM | POA: Diagnosis present

## 2017-03-12 DIAGNOSIS — F0390 Unspecified dementia without behavioral disturbance: Secondary | ICD-10-CM | POA: Diagnosis present

## 2017-03-12 DIAGNOSIS — E119 Type 2 diabetes mellitus without complications: Secondary | ICD-10-CM | POA: Diagnosis present

## 2017-03-12 DIAGNOSIS — R001 Bradycardia, unspecified: Secondary | ICD-10-CM | POA: Diagnosis present

## 2017-03-12 DIAGNOSIS — I6330 Cerebral infarction due to thrombosis of unspecified cerebral artery: Secondary | ICD-10-CM | POA: Diagnosis not present

## 2017-03-12 DIAGNOSIS — E559 Vitamin D deficiency, unspecified: Secondary | ICD-10-CM | POA: Diagnosis present

## 2017-03-12 DIAGNOSIS — Z87891 Personal history of nicotine dependence: Secondary | ICD-10-CM

## 2017-03-12 DIAGNOSIS — R296 Repeated falls: Secondary | ICD-10-CM | POA: Diagnosis present

## 2017-03-12 DIAGNOSIS — I080 Rheumatic disorders of both mitral and aortic valves: Secondary | ICD-10-CM | POA: Diagnosis present

## 2017-03-12 DIAGNOSIS — Z79899 Other long term (current) drug therapy: Secondary | ICD-10-CM | POA: Diagnosis not present

## 2017-03-12 DIAGNOSIS — I2510 Atherosclerotic heart disease of native coronary artery without angina pectoris: Secondary | ICD-10-CM | POA: Diagnosis present

## 2017-03-12 DIAGNOSIS — I6200 Nontraumatic subdural hemorrhage, unspecified: Secondary | ICD-10-CM | POA: Diagnosis present

## 2017-03-12 DIAGNOSIS — L03116 Cellulitis of left lower limb: Secondary | ICD-10-CM | POA: Diagnosis present

## 2017-03-12 DIAGNOSIS — E785 Hyperlipidemia, unspecified: Secondary | ICD-10-CM | POA: Diagnosis present

## 2017-03-12 DIAGNOSIS — Z66 Do not resuscitate: Secondary | ICD-10-CM | POA: Diagnosis present

## 2017-03-12 DIAGNOSIS — I10 Essential (primary) hypertension: Secondary | ICD-10-CM | POA: Diagnosis present

## 2017-03-12 DIAGNOSIS — R4182 Altered mental status, unspecified: Secondary | ICD-10-CM

## 2017-03-12 DIAGNOSIS — C9110 Chronic lymphocytic leukemia of B-cell type not having achieved remission: Secondary | ICD-10-CM | POA: Diagnosis present

## 2017-03-12 DIAGNOSIS — I454 Nonspecific intraventricular block: Secondary | ICD-10-CM | POA: Diagnosis present

## 2017-03-12 DIAGNOSIS — I69328 Other speech and language deficits following cerebral infarction: Secondary | ICD-10-CM | POA: Diagnosis not present

## 2017-03-12 DIAGNOSIS — G8311 Monoplegia of lower limb affecting right dominant side: Secondary | ICD-10-CM | POA: Diagnosis not present

## 2017-03-12 DIAGNOSIS — L03113 Cellulitis of right upper limb: Secondary | ICD-10-CM | POA: Diagnosis present

## 2017-03-12 DIAGNOSIS — Z7984 Long term (current) use of oral hypoglycemic drugs: Secondary | ICD-10-CM | POA: Diagnosis not present

## 2017-03-12 DIAGNOSIS — I639 Cerebral infarction, unspecified: Secondary | ICD-10-CM | POA: Diagnosis present

## 2017-03-12 DIAGNOSIS — Z951 Presence of aortocoronary bypass graft: Secondary | ICD-10-CM | POA: Diagnosis not present

## 2017-03-12 DIAGNOSIS — Z880 Allergy status to penicillin: Secondary | ICD-10-CM | POA: Diagnosis not present

## 2017-03-12 HISTORY — DX: Cerebral infarction, unspecified: I63.9

## 2017-03-12 LAB — URINALYSIS, ROUTINE W REFLEX MICROSCOPIC
Bilirubin Urine: NEGATIVE
GLUCOSE, UA: NEGATIVE mg/dL
HGB URINE DIPSTICK: NEGATIVE
Ketones, ur: NEGATIVE mg/dL
Leukocytes, UA: NEGATIVE
Nitrite: NEGATIVE
Protein, ur: NEGATIVE mg/dL
SPECIFIC GRAVITY, URINE: 1.01 (ref 1.005–1.030)
pH: 5 (ref 5.0–8.0)

## 2017-03-12 LAB — CBC
HCT: 38.8 % — ABNORMAL LOW (ref 39.0–52.0)
HEMOGLOBIN: 12.7 g/dL — AB (ref 13.0–17.0)
MCH: 28.3 pg (ref 26.0–34.0)
MCHC: 32.7 g/dL (ref 30.0–36.0)
MCV: 86.6 fL (ref 78.0–100.0)
PLATELETS: 121 10*3/uL — AB (ref 150–400)
RBC: 4.48 MIL/uL (ref 4.22–5.81)
RDW: 15.3 % (ref 11.5–15.5)
WBC: 33.6 10*3/uL — ABNORMAL HIGH (ref 4.0–10.5)

## 2017-03-12 LAB — COMPREHENSIVE METABOLIC PANEL
ALK PHOS: 134 U/L — AB (ref 38–126)
ALT: 22 U/L (ref 17–63)
AST: 22 U/L (ref 15–41)
Albumin: 3.7 g/dL (ref 3.5–5.0)
Anion gap: 8 (ref 5–15)
BUN: 21 mg/dL — ABNORMAL HIGH (ref 6–20)
CALCIUM: 9 mg/dL (ref 8.9–10.3)
CO2: 25 mmol/L (ref 22–32)
CREATININE: 1.08 mg/dL (ref 0.61–1.24)
Chloride: 103 mmol/L (ref 101–111)
Glucose, Bld: 97 mg/dL (ref 65–99)
Potassium: 4.3 mmol/L (ref 3.5–5.1)
Sodium: 136 mmol/L (ref 135–145)
Total Bilirubin: 1.7 mg/dL — ABNORMAL HIGH (ref 0.3–1.2)
Total Protein: 5.7 g/dL — ABNORMAL LOW (ref 6.5–8.1)

## 2017-03-12 LAB — TSH: TSH: 2.207 u[IU]/mL (ref 0.350–4.500)

## 2017-03-12 LAB — TROPONIN I

## 2017-03-12 LAB — LIPID PANEL
Cholesterol: 162 mg/dL (ref 0–200)
HDL: 27 mg/dL — AB (ref >40)
LDL CALC: 110 mg/dL — AB (ref 0–99)
TRIGLYCERIDES: 124 mg/dL (ref <150)
Total CHOL/HDL Ratio: 6 RATIO
VLDL: 25 mg/dL (ref 0–40)

## 2017-03-12 LAB — CBG MONITORING, ED: Glucose-Capillary: 75 mg/dL (ref 65–99)

## 2017-03-12 LAB — I-STAT TROPONIN, ED: TROPONIN I, POC: 0.01 ng/mL (ref 0.00–0.08)

## 2017-03-12 LAB — HEMOGLOBIN A1C
Hgb A1c MFr Bld: 7.9 % — ABNORMAL HIGH (ref 4.8–5.6)
MEAN PLASMA GLUCOSE: 180.03 mg/dL

## 2017-03-12 LAB — MAGNESIUM: Magnesium: 1.9 mg/dL (ref 1.7–2.4)

## 2017-03-12 LAB — VITAMIN B12: VITAMIN B 12: 370 pg/mL (ref 180–914)

## 2017-03-12 LAB — GLUCOSE, CAPILLARY: GLUCOSE-CAPILLARY: 128 mg/dL — AB (ref 65–99)

## 2017-03-12 MED ORDER — METOPROLOL TARTRATE 12.5 MG HALF TABLET
12.5000 mg | ORAL_TABLET | Freq: Two times a day (BID) | ORAL | Status: DC
  Administered 2017-03-12 – 2017-03-15 (×6): 12.5 mg via ORAL
  Filled 2017-03-12 (×6): qty 1

## 2017-03-12 MED ORDER — ATORVASTATIN CALCIUM 20 MG PO TABS
20.0000 mg | ORAL_TABLET | Freq: Every day | ORAL | Status: DC
  Administered 2017-03-12 – 2017-03-13 (×2): 20 mg via ORAL
  Filled 2017-03-12 (×2): qty 1

## 2017-03-12 MED ORDER — MEMANTINE HCL 10 MG PO TABS
5.0000 mg | ORAL_TABLET | Freq: Two times a day (BID) | ORAL | Status: DC
  Administered 2017-03-12 – 2017-03-15 (×6): 5 mg via ORAL
  Filled 2017-03-12 (×6): qty 1

## 2017-03-12 MED ORDER — SODIUM CHLORIDE 0.9 % IV SOLN
1250.0000 mg | INTRAVENOUS | Status: DC
  Administered 2017-03-13: 1250 mg via INTRAVENOUS
  Filled 2017-03-12: qty 1250

## 2017-03-12 MED ORDER — VANCOMYCIN HCL IN DEXTROSE 1-5 GM/200ML-% IV SOLN
1000.0000 mg | Freq: Once | INTRAVENOUS | Status: AC
  Administered 2017-03-12: 1000 mg via INTRAVENOUS
  Filled 2017-03-12: qty 200

## 2017-03-12 MED ORDER — INSULIN ASPART 100 UNIT/ML ~~LOC~~ SOLN
0.0000 [IU] | Freq: Three times a day (TID) | SUBCUTANEOUS | Status: DC
  Administered 2017-03-13 – 2017-03-14 (×3): 2 [IU] via SUBCUTANEOUS
  Administered 2017-03-14: 5 [IU] via SUBCUTANEOUS
  Administered 2017-03-14 – 2017-03-15 (×2): 2 [IU] via SUBCUTANEOUS
  Administered 2017-03-15: 3 [IU] via SUBCUTANEOUS

## 2017-03-12 MED ORDER — ENOXAPARIN SODIUM 40 MG/0.4ML ~~LOC~~ SOLN
40.0000 mg | SUBCUTANEOUS | Status: DC
  Administered 2017-03-12: 40 mg via SUBCUTANEOUS
  Filled 2017-03-12: qty 0.4

## 2017-03-12 MED ORDER — ASPIRIN EC 325 MG PO TBEC
325.0000 mg | DELAYED_RELEASE_TABLET | Freq: Every day | ORAL | Status: DC
  Administered 2017-03-12: 325 mg via ORAL
  Filled 2017-03-12: qty 1

## 2017-03-12 MED ORDER — ACETAMINOPHEN 650 MG RE SUPP: 650.0000 mg | Freq: Four times a day (QID) | RECTAL | Status: DC | PRN

## 2017-03-12 MED ORDER — ACETAMINOPHEN 325 MG PO TABS
650.0000 mg | ORAL_TABLET | Freq: Four times a day (QID) | ORAL | Status: DC | PRN
  Administered 2017-03-12 – 2017-03-14 (×3): 650 mg via ORAL
  Filled 2017-03-12 (×3): qty 2

## 2017-03-12 NOTE — H&P (Signed)
[facility] Admission History and Physical Service Pager: [phone]  Patient name: Nathaniel Burgess      Medical record number: [id number] Date of birth: [birth year]        Age: 76 y.o.    Gender: male  Primary Care Provider: Chevis Pretty, FNP Consultants: none Code Status: DNR  Chief Complaint: weakness, left leg redness  Assessment and Plan: Nathaniel Burgess is a 76 y.o. male presenting with weakness, left leg redness. PMH is significant for CLL, DM, HTN, 2 strokes-one as recent as May 2018 and CABG ~5 years ago per patient.  Generalized Weakness, gait abnormality: Acute onset of generalized weakness that started on morning of admission. At baseline able to ambulate on own with the use of a walker. No new focal deficits on neuro exam (family reports mild RUE and RLE weakness and some slurred speech). However, patient feels too unwell to stand with 2 person assist. Family thinks speech could be slightly worse. CT head was negative for any acute findings, a 21mm nodule in the right parotid could be a lymph node or mass. 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 CXR showed no acute cardiopulmonary findings. EKG normal. Troponin I <0.03x1. UA normal. Aspirin 325mg . Possible etiology includes acute stroke, malnutrition, vitamin b12 deficiency, folate deficiency, hypomagnesemia, Guillain Barr, deconditioning, or sepsis.   -MRI to assess for acute stroke -A1C, Lipid panel, TSH -PT/OT -Repeat EKG  -B12 and Folate -Mg   Cellulitis of LLE: Leukocytosis, 33.6 but does have CLL. Is afebrile. LLE erythema with mild tenderness. Recent history of right arm cellulitis, taking Keflex 500mg  3x/day, day 10. Given that patient developed LLE cellulitis while on Keflex, will continue vancomycin pending clinical improvement. Less likely vascular insufficiency as unilateral and warm. -Continue IV Vanc per pharmacy  Hypertension: Home medication: Metoprolol Tartrate 25mg   tablet-Takes 1/2 pill (12.5mg ) 2x daily. Normotensive on admission. -restart home metoprolol if MRI negative for acute stroke   Diabetes Mellitus: Home medications: Glipizide 5mg  BID before a meal, Glimepiride 2mg  QID with breakfast -hold home medications -A1C -CBGs AC QHS -sensitive sliding scale  Hyperlipidemia: Home medications: Atorvastatin 20mg  QID -continue lipitor 20mg  -ask family why 20mg  rather than high dose statin given hx of CVA (possible intolerance?)  Vitamin D deficiency: Home medication Cholecalciferol 1000 units QID -continue home vitamin D supplementation  Dementia: home med is Namenda. Continue this as it can have rebound tachycardia when stopped.  -continue namenda  FEN/GI: Heart diet, IVSL Prophylaxis: Lovenox  Disposition: admit to med surg  History of Present Illness:  [name] is a 76 y.o. male presenting with generalized weakness and confusion that began this morning. 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 His son was with him at bedside and states that his father seems to be slurring his speech more and is weaker than usual. He states that he is normally able to walk with his walker on his own. The last time he saw his dad was on Saturday, noting that he was able to stand and walk without help.Today, [name] cannot stand or walk using the walker. There is also a complaint of LLE erythema that is warm, new since yesterday. He is currently being treated with keflex for right arm cellulitis that was treated for on 8/25 with a 10 day course. Patient admits to more frequent falls within the last 6 weeks. Family denies fever at home. Does  have baseline dementia, but AOx4. Patient denies fevers, cough, HA, CP, SOB, N/V/D, abd pain, dysuria, or changes in bowel movement. Endorses history of remote alcohol use, smoked 1ppd x 40 years but quit 10 years ago, smokeless  tobacco until 5 years ago, no drugs.   Review Of Systems: Per HPI with the following additions: none  ROS      Patient Active Problem List   Diagnosis Date Noted  . 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 Thrombocytopenia ([place]) 04/15/2014    Past Medical History:     Past Medical History:  Diagnosis Date  . Chronic lymphocytic leukemia ([place])   . Diabetes mellitus     Past Surgical History: History reviewed. No pertinent surgical history.  Social History:     Social History  Substance Use Topics  . Smoking status: Former Smoker for 40-45years, about 1ppd. Quit 10 years ago.  . Smokeless tobacco: Quit 5 years ago  . Alcohol use Not on file   Additional social history: lives with children for the last 6 weeks  Please also refer to relevant sections of EMR.  Family History: No family history on file. Patient cannot recall, son reports that the patient's children are without PMH.   Allergies and Medications:     Allergies  Allergen Reactions  . Amoxicillin Rash   No current facility-administered medications on file prior to encounter.          Current Outpatient Prescriptions on File Prior to Encounter  Medication Sig Dispense Refill  . aspirin 325 MG EC tablet Take 325 mg by mouth daily.    [name] atorvastatin (LIPITOR) 40 MG tablet Take 40 mg by mouth daily.    . cephALEXin (KEFLEX) 500 MG capsule Take 1 capsule (500 mg total) by mouth 3 (three) times daily. 30 capsule 0  . Cholecalciferol (VITAMIN D3) 1000 UNITS CAPS Take 1 capsule by mouth daily.    [name] glipiZIDE (GLUCOTROL) 5 MG tablet Take by mouth 2 (two) times daily before a meal.    . memantine (NAMENDA) 5 MG tablet Take 5 mg by mouth 2 (two) times daily.    . metoprolol tartrate (LOPRESSOR) 25 MG tablet Take 25 mg by mouth. Take 1/2 tab daily      Objective: BP 136/83   Pulse 88   Temp 98 F (36.7 C) (Oral)   Resp (!) 25   SpO2 94%  Exam: General: Elderly  male, pleasant, in no acute distress Eyes: Mild redness of the right eye, EOMI, PERRLA ENTM: Mild dry mucous membranes Neck: supple, no JVD Cardiovascular: Regular, rate and rhythm, normal S1 and S2, without murmurs, rubs or gallops Respiratory: Normal work of breathing, clear to auscultation bilaterally, without ronchi, rales or wheeze Gastrointestinal: Normal bowel sounds, without tenderness MSK: Normal tone and movement Derm: Erythema of the right dorsum of hand/wrist and LLE (warm, mildly TTP, demarcated and marked by skin marker). Mild erythema of left arm around IV site. Without rash. Well healed scar of middle chest from CABG. Neuro: Alert and oriented x3. Cranial nerves grossly intact. Strength 4/5 in RUE, 5/5 in remaining extremities. Sensation intact in all extremities and face. Psych: mood and affect appropriate  Labs and Imaging: CBC BMET   Last Labs    Recent Labs Lab 03/12/17 1152  WBC 33.6*  HGB 12.7*  HCT 38.8*  PLT 121*      Last Labs    Recent Labs Lab 03/12/17 1152  NA 136  K 4.3  CL 103  CO2 25  BUN 21*  CREATININE 1.08  GLUCOSE 97  CALCIUM 9.0       Ct Head Wo Contrast  Result Date: 03/12/2017 CLINICAL DATA:  Focal neuro deficit.  Generalized weakness today. EXAM: CT HEAD WITHOUT CONTRAST TECHNIQUE: Contiguous axial images were obtained from the base of the skull through the vertex without intravenous contrast. COMPARISON:  None. FINDINGS: Brain: No evidence of acute infarction, hemorrhage, hydrocephalus, extra-axial collection or mass lesion/mass effect. Generalized atrophy, moderate for age. Chronic microvascular ischemia in the cerebral white matter including remote small vessel infarct in the left corona radiata. Vascular: Atherosclerotic calcification.  No hyperdense vessel. Skull: No acute or aggressive finding Sinuses/Orbits: No acute finding Other: There is a 12 mm (craniocaudal) nodule in the right parotid which could be mass or lymph  node. IMPRESSION: 1. No acute finding. 2. Moderate atrophy and chronic microvascular ischemia. 3. 12 mm nodule in the right parotid that could be lymph node or mass. Per chart the patient has CLL, and this could be related. Electronically Signed   By: Monte Fantasia M.D.   On: 03/12/2017 12:31   Dg Chest Port 1 View  Result Date: 03/12/2017 CLINICAL DATA:  Weakness and dizziness today. EXAM: PORTABLE CHEST 1 VIEW COMPARISON:  None. FINDINGS: The heart is normal in size. Mild tortuosity and calcification of the thoracic aorta. The pulmonary hila appear normal. Surgical changes from bypass surgery are noted. Mild eventration of the right hemidiaphragm. The lungs are clear. No infiltrates or effusions. The bony thorax is intact. IMPRESSION: No acute cardiopulmonary findings. Electronically Signed   By: Marijo Sanes M.D.   On: 03/12/2017 12:41    Ophelia Charter, MS4 03/12/2017, 3:08 PM  FPTS Upper-Level Resident Addendum  I have independently interviewed and examined the patient. I have discussed the above with the original author and agree with their documentation. My edits for correction/addition/clarification are in blue. Please see also any attending notes.   Ralene Ok, MD PGY-2, [place] Service pager: [phone] (text pages welcome through [facility])

## 2017-03-12 NOTE — ED Notes (Signed)
Attempted report x1. 

## 2017-03-12 NOTE — Progress Notes (Signed)
Pharmacy Antibiotic Note  Sederick Jacobsen is a 76 y.o. male admitted on 03/12/2017 with generalized weakness.  Pharmacy has been consulted for vancomycin dosing for LLE cellulitis.  Baseline labs reviewed and noted that patient received vancomycin 1gm in the ED around 1300.   Plan: Vanc 1250mg  IV Q24H Monitor renal fxn, clinical progress, vanc trough as indicated   Weight: 153 lb 1.6 oz (69.4 kg)  Temp (24hrs), Avg:98.1 F (36.7 C), Min:98 F (36.7 C), Max:98.2 F (36.8 C)   Recent Labs Lab 03/12/17 1152  WBC 33.6*  CREATININE 1.08    Estimated Creatinine Clearance: 55.3 mL/min (by C-G formula based on SCr of 1.08 mg/dL).    Allergies  Allergen Reactions  . Amoxicillin Rash     Vanc 9/4 >>  9/4 BCx -     Genella Bas D. Mina Marble, PharmD, BCPS Pager:  [phone] 03/12/2017, 5:36 PM

## 2017-03-12 NOTE — ED Provider Notes (Signed)
[facility] DEPT Provider Note   CSN: [id number] Arrival date & time: 03/12/17  1142     History   Chief Complaint Chief Complaint  Patient presents with  . Weakness    HPI Nathaniel Burgess is a 76 y.o. male.  HPI Patient with history of stroke and residual right-sided leg weakness. He isnormally ambulatory with walker. Patient's son states that for the past 2 days he's had difficulty using his walker. He's had increased confusion. Patient admits to mild cough. No known fever or chills. No vomiting or diarrhea. Patient does have frequent falls. No known head injury. Denies headache, neck pain, chest pain or abdominal pain. Past Medical History:  Diagnosis Date  . Chronic lymphocytic leukemia ([place])   . Diabetes mellitus     Patient Active Problem List   Diagnosis Date Noted  . CLL (chronic lymphocytic leukemia) ([place]) 04/15/2014  . Thrombocytopenia ([place]) 04/15/2014    History reviewed. No pertinent surgical history.     Home Medications    Prior to Admission medications   Medication Sig Start Date End Date Taking? Authorizing Provider  aspirin 325 MG EC tablet Take 325 mg by mouth daily.    [provider]  atorvastatin (LIPITOR) 40 MG tablet Take 40 mg by mouth daily.    [provider]  cephALEXin (KEFLEX) 500 MG capsule Take 1 capsule (500 mg total) by mouth 3 (three) times daily. 03/02/17   Terald Sleeper, PA-C  Cholecalciferol (VITAMIN D3) 1000 UNITS CAPS Take 1 capsule by mouth daily.    [provider]  glipiZIDE (GLUCOTROL) 5 MG tablet Take by mouth 2 (two) times daily before a meal.    [provider]  memantine (NAMENDA) 5 MG tablet Take 5 mg by mouth 2 (two) times daily.    [provider]  metoprolol tartrate (LOPRESSOR) 25 MG tablet Take 25 mg by mouth. Take 1/2 tab daily    [provider]    Family History No family history on file.  Social History Social History  Substance Use Topics  .  Smoking status: Never Smoker  . Smokeless tobacco: Never Used  . Alcohol use Not on file     Allergies   Amoxicillin   Review of Systems Review of Systems  Constitutional: Positive for fatigue. Negative for chills and fever.  Respiratory: Positive for cough. Negative for shortness of breath.   Cardiovascular: Negative for chest pain, palpitations and leg swelling.  Gastrointestinal: Negative for abdominal pain, diarrhea, nausea and vomiting.  Genitourinary: Negative for dysuria, flank pain and frequency.  Musculoskeletal: Positive for gait problem. Negative for back pain and neck pain.  Skin: Positive for color change.  Neurological: Positive for weakness. Negative for dizziness, light-headedness, numbness and headaches.  Psychiatric/Behavioral: Positive for confusion.  All other systems reviewed and are negative.    Physical Exam Updated Vital Signs BP 136/83   Pulse 88   Temp 98 F (36.7 C) (Oral)   Resp (!) 25   SpO2 94%   Physical Exam  Constitutional: He is oriented to person, place, and time. He appears well-developed and well-nourished. No distress.  HENT:  Head: Normocephalic and atraumatic.  Mouth/Throat: Oropharynx is clear and moist. No oropharyngeal exudate.  Eyes: Pupils are equal, round, and reactive to light. EOM are normal.  Neck: Normal range of motion. Neck supple.  No posterior midline cervical tenderness to palpation. No meningismus.  Cardiovascular: Normal rate and regular rhythm.  Exam reveals no gallop and no friction rub.  No murmur heard. Pulmonary/Chest: Effort normal. No respiratory distress. He has no wheezes. He has no rales. He exhibits no tenderness.  diminished breath sounds in right base.  Abdominal: Soft. Bowel sounds are normal. There is no tenderness. There is no rebound and no guarding.  Musculoskeletal: Normal range of motion. He exhibits no edema or tenderness.  Patient has redness, warmth and tenderness to palpation to the left  pretibial space extending to the ankle. No crepitance appreciated. Distal pulses intact.  Neurological: He is alert and oriented to person, place, and time.  Dysarthria. Cranial nerves II through XII grossly intact. Bilateral upper extremity strength 5/5. 4/5 in right lower extremities strength and 5/5 left lower extremity strength. Sensation is grossly intact.  Skin: Skin is warm and dry. No rash noted. No erythema.  Psychiatric: He has a normal mood and affect. His behavior is normal.  Nursing note and vitals reviewed.    ED Treatments / Results  Labs (all labs ordered are listed, but only abnormal results are displayed) Labs Reviewed  CBC - Abnormal; Notable for the following:       Result Value   WBC 33.6 (*)    Hemoglobin 12.7 (*)    HCT 38.8 (*)    Platelets 121 (*)    All other components within normal limits  COMPREHENSIVE METABOLIC PANEL - Abnormal; Notable for the following:    BUN 21 (*)    Total Protein 5.7 (*)    Alkaline Phosphatase 134 (*)    Total Bilirubin 1.7 (*)    All other components within normal limits  URINALYSIS, ROUTINE W REFLEX MICROSCOPIC  I-STAT TROPONIN, ED    EKG  EKG Interpretation None       Radiology Ct Head Wo Contrast  Result Date: 03/12/2017 CLINICAL DATA:  Focal neuro deficit.  Generalized weakness today. EXAM: CT HEAD WITHOUT CONTRAST TECHNIQUE: Contiguous axial images were obtained from the base of the skull through the vertex without intravenous contrast. COMPARISON:  None. FINDINGS: Brain: No evidence of acute infarction, hemorrhage, hydrocephalus, extra-axial collection or mass lesion/mass effect. Generalized atrophy, moderate for age. Chronic microvascular ischemia in the cerebral white matter including remote small vessel infarct in the left corona radiata. Vascular: Atherosclerotic calcification.  No hyperdense vessel. Skull: No acute or aggressive finding Sinuses/Orbits: No acute finding Other: There is a 12 mm (craniocaudal)  nodule in the right parotid which could be mass or lymph node. IMPRESSION: 1. No acute finding. 2. Moderate atrophy and chronic microvascular ischemia. 3. 12 mm nodule in the right parotid that could be lymph node or mass. Per chart the patient has CLL, and this could be related. Electronically Signed   By: Monte Fantasia M.D.   On: 03/12/2017 12:31   Dg Chest Port 1 View  Result Date: 03/12/2017 CLINICAL DATA:  Weakness and dizziness today. EXAM: PORTABLE CHEST 1 VIEW COMPARISON:  None. FINDINGS: The heart is normal in size. Mild tortuosity and calcification of the thoracic aorta. The pulmonary hila appear normal. Surgical changes from bypass surgery are noted. Mild eventration of the right hemidiaphragm. The lungs are clear. No infiltrates or effusions. The bony thorax is intact. IMPRESSION: No acute cardiopulmonary findings. Electronically Signed   By: Marijo Sanes M.D.   On: 03/12/2017 12:41    Procedures Procedures (including critical care time)  Medications Ordered in ED Medications  vancomycin (VANCOCIN) IVPB 1000 mg/200 mL premix (1,000 mg Intravenous New Bag/Given 03/12/17 1258)     Initial Impression / Assessment and Plan /  ED Course  I have reviewed the triage vital signs and the nursing notes.  Pertinent labs & imaging results that were available during my care of the patient were reviewed by me and considered in my medical decision making (see chart for details).     started IV vancomycin for cellulitis. CT without evidence of stroke.discussed with family medicine resident and will admit.  Final Clinical Impressions(s) / ED Diagnoses   Final diagnoses:  Generalized weakness  Cellulitis of left leg    New Prescriptions New Prescriptions   No medications on file     Julianne Rice, MD 03/12/17 1345

## 2017-03-12 NOTE — ED Triage Notes (Signed)
Pt presents via [facility] for evaluation of generalized weakness today. Pt denies pain, no focal neuro symptoms. Pt hx of CVA with R sided deficits and residual slurred speech which family reports is at baseline today. Pt also taking medication for dementia, AxO x4 on arrival to ED. Pt typically ambulates with walker but was requiring wheelchair today.

## 2017-03-12 NOTE — ED Notes (Signed)
Pt and family updated to delay. Pt requesting food/drink. Per pt son, providers advised pt not to eat. This RN to confirm with admitting that pt cannot eat or drink.

## 2017-03-13 ENCOUNTER — Inpatient Hospital Stay (HOSPITAL_COMMUNITY): Payer: Medicare Other

## 2017-03-13 ENCOUNTER — Encounter (HOSPITAL_COMMUNITY): Payer: Self-pay | Admitting: *Deleted

## 2017-03-13 DIAGNOSIS — I639 Cerebral infarction, unspecified: Principal | ICD-10-CM

## 2017-03-13 DIAGNOSIS — I6330 Cerebral infarction due to thrombosis of unspecified cerebral artery: Secondary | ICD-10-CM | POA: Insufficient documentation

## 2017-03-13 LAB — GLUCOSE, CAPILLARY
GLUCOSE-CAPILLARY: 71 mg/dL (ref 65–99)
GLUCOSE-CAPILLARY: 197 mg/dL — AB (ref 65–99)
GLUCOSE-CAPILLARY: 208 mg/dL — AB (ref 65–99)
Glucose-Capillary: 165 mg/dL — ABNORMAL HIGH (ref 65–99)

## 2017-03-13 LAB — BASIC METABOLIC PANEL
ANION GAP: 11 (ref 5–15)
BUN: 20 mg/dL (ref 6–20)
CHLORIDE: 103 mmol/L (ref 101–111)
CO2: 25 mmol/L (ref 22–32)
Calcium: 8.6 mg/dL — ABNORMAL LOW (ref 8.9–10.3)
Creatinine, Ser: 1.08 mg/dL (ref 0.61–1.24)
GFR calc non Af Amer: >60 mL/min (ref >60)
Glucose, Bld: 155 mg/dL — ABNORMAL HIGH (ref 65–99)
Potassium: 4.4 mmol/L (ref 3.5–5.1)
Sodium: 139 mmol/L (ref 135–145)

## 2017-03-13 LAB — VAS US CAROTID
LCCAPDIAS: 11 cm/s
LEFT ECA DIAS: -15 cm/s
LEFT VERTEBRAL DIAS: 10 cm/s
LICAPDIAS: -15 cm/s
Left CCA dist dias: 8 cm/s
Left CCA dist sys: 65 cm/s
Left CCA prox sys: 86 cm/s
Left ICA dist dias: -11 cm/s
Left ICA dist sys: -51 cm/s
Left ICA prox sys: -54 cm/s
RCCADSYS: -62 cm/s
RCCAPDIAS: 4 cm/s
RIGHT ECA DIAS: -2 cm/s
RIGHT VERTEBRAL DIAS: 12 cm/s
Right CCA prox sys: 70 cm/s

## 2017-03-13 LAB — CBC
HEMATOCRIT: 40.5 % (ref 39.0–52.0)
HEMOGLOBIN: 13.2 g/dL (ref 13.0–17.0)
MCH: 28.6 pg (ref 26.0–34.0)
MCHC: 32.6 g/dL (ref 30.0–36.0)
MCV: 87.9 fL (ref 78.0–100.0)
Platelets: 128 10*3/uL — ABNORMAL LOW (ref 150–400)
RBC: 4.61 MIL/uL (ref 4.22–5.81)
RDW: 15.9 % — ABNORMAL HIGH (ref 11.5–15.5)
WBC: 35 10*3/uL — AB (ref 4.0–10.5)

## 2017-03-13 LAB — TROPONIN I

## 2017-03-13 MED ORDER — DOXYCYCLINE HYCLATE 100 MG PO TABS
100.0000 mg | ORAL_TABLET | Freq: Two times a day (BID) | ORAL | Status: DC
  Administered 2017-03-13 – 2017-03-15 (×5): 100 mg via ORAL
  Filled 2017-03-13 (×5): qty 1

## 2017-03-13 MED ORDER — ATORVASTATIN CALCIUM 40 MG PO TABS
40.0000 mg | ORAL_TABLET | Freq: Every day | ORAL | Status: DC
  Administered 2017-03-14 – 2017-03-15 (×2): 40 mg via ORAL
  Filled 2017-03-13 (×2): qty 1

## 2017-03-13 MED ORDER — CLOPIDOGREL BISULFATE 75 MG PO TABS
75.0000 mg | ORAL_TABLET | Freq: Every day | ORAL | Status: DC
  Administered 2017-03-13 – 2017-03-15 (×3): 75 mg via ORAL
  Filled 2017-03-13 (×3): qty 1

## 2017-03-13 NOTE — Clinical Social Work Note (Signed)
Clinical Social Work Assessment  Patient Details  Name: Nathaniel Burgess MRN: [id number] Date of Birth: March 03, 1941  Date of referral:  03/13/17               Reason for consult:  Facility Placement                Permission sought to share information with:  Facility Sport and exercise psychologist, Family Supports Permission granted to share information::  Yes, Verbal Permission Granted  Name::     Risk manager::  SNFs  Relationship::  Son  Contact Information:  [phone]  Housing/Transportation Living arrangements for the past 2 months:  Single Family Home Source of Information:  Patient, Adult Children Patient Interpreter Needed:  None Criminal Activity/Legal Involvement Pertinent to Current Situation/Hospitalization:  No - Comment as needed Significant Relationships:  Adult Children Lives with:  Adult Children Do you feel safe going back to the place where you live?  No Need for family participation in patient care:  Yes (Comment)  Care giving concerns:  CSW received consult for possible SNF placement at time of discharge. CSW spoke with patient's son regarding PT recommendation of SNF placement at time of discharge. Patient's son states that patient lives in [place] with his wife but is currently staying with his son and they are unable to care for patient at their home given patient's current physical needs and fall risk. Patient's son expressed understanding of PT recommendation and is agreeable to SNF placement at time of discharge. CSW to continue to follow and assist with discharge planning needs.   Social Worker assessment / plan:  CSW spoke with patient's son concerning possibility of rehab at [facility] before returning home.  Employment status:  Retired Nurse, adult PT Recommendations:  [place] / Referral to community resources:  [place]  Patient/Family's Response to care:  Patient's son recognizes need  for rehab before returning home and is agreeable to a SNF in [place] if they are unable to transport patient back to a [place] SNF. CSW emailed son SNF list.   Patient/Family's Understanding of and Emotional Response to Diagnosis, Current Treatment, and Prognosis:  Patient/family is realistic regarding therapy needs and expressed being hopeful for SNF placement. Patient's son expressed understanding of CSW role and discharge process as well as medical condition. No questions/concerns about plan or treatment.    Emotional Assessment Appearance:  Appears stated age Attitude/Demeanor/Rapport:  Other (Appropriate) Affect (typically observed):  Accepting, Appropriate Orientation:  Oriented to Self, Oriented to Place, Oriented to Situation Alcohol / Substance use:  Not Applicable Psych involvement (Current and /or in the community):  No (Comment)  Discharge Needs  Concerns to be addressed:  Care Coordination Readmission within the last 30 days:  No Current discharge risk:  None Barriers to Discharge:  Continued Medical Work up   [employer], [place] 03/13/2017, 3:10 PM

## 2017-03-13 NOTE — Progress Notes (Signed)
STROKE TEAM PROGRESS NOTE   HISTORY OF PRESENT ILLNESS (per record) Nathaniel Burgess is a 76 y.o. male with a history of CLL, DM2, and prior CVAs who presented with generalized weakness and was felt to have left lower extremity cellulitis. He denies confusion or slurred speech. States his slurred speech is an old deficit.    LKW: Unclear  Patient was not administered IV t-PA secondary to arriving outside of the tPA treatment window. He was admitted to General neurology for further evaluation and treatment.   SUBJECTIVE (INTERVAL HISTORY) His wife is currently admitted to [facility] hospital as well.  The pt is alert, oriented, and follows all commands appropriately.He states aspirin bothers his stomach.He denies any prior h/o strokes   OBJECTIVE Temp:  [97.5 F (36.4 C)-98.2 F (36.8 C)] 98 F (36.7 C) (09/05 1442) Pulse Rate:  [61-82] 79 (09/05 1442) Cardiac Rhythm: Sinus bradycardia (09/05 0700) Resp:  [16-18] 16 (09/05 1442) BP: (120-129)/(55-65) 120/55 (09/05 1442) SpO2:  [94 %-99 %] 94 % (09/05 1442)  CBC:   Recent Labs Lab 03/12/17 1152 03/13/17 1237  WBC 33.6* 35.0*  HGB 12.7* 13.2  HCT 38.8* 40.5  MCV 86.6 87.9  PLT 121* 128*    Basic Metabolic Panel:   Recent Labs Lab 03/12/17 1152 03/12/17 2125 03/13/17 1237  NA 136  --  139  K 4.3  --  4.4  CL 103  --  103  CO2 25  --  25  GLUCOSE 97  --  155*  BUN 21*  --  20  CREATININE 1.08  --  1.08  CALCIUM 9.0  --  8.6*  MG  --  1.9  --     Lipid Panel:     Component Value Date/Time   CHOL 162 03/12/2017 2125   TRIG 124 03/12/2017 2125   HDL 27 (L) 03/12/2017 2125   CHOLHDL 6.0 03/12/2017 2125   VLDL 25 03/12/2017 2125   LDLCALC 110 (H) 03/12/2017 2125   HgbA1c:  Lab Results  Component Value Date   HGBA1C 7.9 (H) 03/12/2017   Urine Drug Screen: No results found for: LABOPIA, COCAINSCRNUR, LABBENZ, AMPHETMU, THCU, LABBARB  Alcohol Level No results found for: ETH  IMAGING  Ct Head Wo  Contrast 03/13/2017 IMPRESSION: No change by CT. Chronic small-vessel ischemic changes of the white matter. Subacute infarction left corona radiata shown by MRI difficult to specifically delineate by CT. No new finding. Small amount of subdural blood at the left posterior inferior falx and superior tentorium is unchanged. Small amount of subdural blood along the right convexity visible by MRI not visible by CT.  Ct Head Wo Contrast 03/12/2017 IMPRESSION: 1. No acute finding. 2. Moderate atrophy and chronic microvascular ischemia. 3. 12 mm nodule in the right parotid that could be lymph node or mass. Per chart the patient has CLL, and this could be related.  Mr Nathaniel Burgess Head Wo Contrast 03/13/2017 IMPRESSION: 1. No circle of Willis branch occlusion identified. No hemodynamically significant large vessel or proximal arterial stenosis. 2. Posterior circulation atherosclerosis affecting the basilar artery and distal PCA branches without significant stenosis. 3. Trace bilateral subdural hematomas are again evident and appear stable since yesterday.  Mr Brain Wo Contrast 03/12/2017 IMPRESSION: 1. Acute on chronic ischemia in the left corona radiata. 2. Tiny bilateral subdural hematomas.  No mass effect. 3. Mild chronic small vessel ischemic disease and moderate cerebral atrophy.  Dg Chest Port 1 View 03/12/2017 IMPRESSION: No acute cardiopulmonary findings.   Carotid US 03/13/2017 B  ICA 1-39% stenosis, VAs antegrade  TTE 03/13/2017  pending  TEE 03/15/2017  pending    PHYSICAL EXAM Obese elderly Caucasian male not in distress. . Afebrile. Head is nontraumatic. Neck is supple without bruit.    Cardiac exam no murmur or gallop. Lungs are clear to auscultation. Distal pulses are well felt. Neurological Exam ;  Awake  Alert oriented x 3. Normal speech and language.Mildly diminished attention and recall.eye movements full without nystagmus.fundi were not visualized. Vision acuity and fields appear normal.  Hearing is normal. Palatal movements are normal. Face symmetric. Tongue midline. Normal strength, tone, reflexes and coordination. Normal sensation. Gait deferred.  ASSESSMENT/PLAN [name] is a 76 y.o. male with history of CLL, DM2, and prior CVAs who presented with generalized weakness and was felt to have left lower extremity cellulitis. He denies confusion or slurred speech. States his slurred speech is an old deficit.  He did not receive IV t-PA due to arriving outside of the tPA treatment window.   Stroke:  Acute on chronic ischemia in the left corona radiata with tiny bilateral subdural hematomas in the setting of CLL.  Incidental finding of 12 mm nodule in the right parotid that could be lymph node or mass.  Resultant  No focal deficits  CT head: no acute stroke  MRI head:  Acute on chronic ischemia in the left corona radiata with tiny bilateral subdural hematomas in the setting of CLL  MRA head: no LVO or high-grade stenosis  Carotid Doppler: B ICA 1-39% stenosis, VAs antegrade  2D Echo   pending   LDL 110  HgbA1c 7.9  SCDs for VTE prophylaxis Diet heart healthy/carb modified Room service appropriate? Yes; Fluid consistency: Thin  aspirin 325 mg daily prior to admission, now on clopidogrel 75 mg daily  Patient counseled to be compliant with his antithrombotic medications  Ongoing aggressive stroke risk factor management  Therapy recommendations: SNF;Supervision/Assistance - 24 hour   Disposition: pending  Hyperlipidemia  Home meds: atorvastatin 20 mg PO daily  LDL 110, goal < 70  Increase to atorvastatin 40 mg PO daily  Continue statin at discharge  Diabetes  HgbA1c 7.9, goal < 7.0  Uncontrolled  Other Stroke Risk Factors  Advanced age  Hx stroke/TIA  CLL  Other Active Problems Small incidental subdural hematomas on MRI -less conspicuous on CT  Hospital day # 1 I have personally examined this patient, reviewed notes, independently  viewed imaging studies, participated in medical decision making and plan of care.ROS completed by me personally and pertinent positives fully documented  I have made any additions or clarifications directly to the above note. He has presented with clinically silent acute on chronic lacunar infarct and is refusing aspirin as it bothers his stomach. He has CLL and tiny subdurals but is at high risk for recurrent strokes and will benefit with antiplatelet therapy as he has had more than 1 stroke. He will need close neurological monitoring on Plavix and may need repeat imaging if he complains of headaches or new focal neurological problems. Explained risk of tiny subdurals worsening on Plavix versus recurrent stroke rik of 10-15% in first year and feel he will benefit from careful trial of antipletelet therapy as well as aggressive risk factor modification. D/w patient and Dr Chrisandra Netters [facility] Attending who agrees with plan.Greater than 50% time during this 35 minute visit were spent on counselling and ccordination of care about his stroke, subdural and answering questions.  Antony Contras, MD Medical Director [facility] Pager: [phone] 03/13/2017 8:35 PM   To contact Stroke Continuity provider, please refer to [email or web address] After hours, contact General Neurology

## 2017-03-13 NOTE — NC FL2 (Signed)
[place] LEVEL OF CARE SCREENING TOOL     IDENTIFICATION  Patient Name: Nathaniel Burgess Birthdate: Oct 09, 1940 Sex: male Admission Date (Current Location): 03/12/2017  [facility] and [place] Number:  Herbalist and Address:  [facility] [place], [place] [address] [place], [place] [address]      Provider Number: [id number]  Attending Physician Name and Address:  Leeanne Rio, MD  Relative Name and Phone Number:  Lanny Hurst, son, [phone]    Current Level of Care: Hospital Recommended Level of Care: [place] Prior Approval Number:    Date Approved/Denied:   PASRR Number: [id number]  Discharge Plan: SNF    Current Diagnoses: Patient Active Problem List   Diagnosis Date Noted  . Cellulitis of left lower extremity 03/12/2017  . Generalized weakness   . Altered mental status   . CLL (chronic lymphocytic leukemia) ([place]) 04/15/2014  . Thrombocytopenia ([place]) 04/15/2014    Orientation RESPIRATION BLADDER Height & Weight     Self, Place, Time  Normal Continent Weight: 69.4 kg (153 lb 1.6 oz) Height:  5\' 10"  (177.8 cm)  BEHAVIORAL SYMPTOMS/MOOD NEUROLOGICAL BOWEL NUTRITION STATUS      Continent Diet (Please see DC Summary)  AMBULATORY STATUS COMMUNICATION OF NEEDS Skin   Limited Assist Verbally Normal                       Personal Care Assistance Level of Assistance  Bathing, Feeding, Dressing Bathing Assistance: Maximum assistance Feeding assistance: Limited assistance Dressing Assistance: Limited assistance     Functional Limitations Info             SPECIAL CARE FACTORS FREQUENCY  PT (By licensed PT)     PT Frequency: 5x/week              Contractures      Additional Factors Info  Code Status, Allergies, Insulin Sliding Scale Code Status Info: DNR Allergies Info: Amoxicillin   Insulin Sliding Scale Info: 3x dialy with meals       Current Medications (03/13/2017):  This is the  current hospital active medication list Current Facility-Administered Medications  Medication Dose Route Frequency Provider Last Rate Last Dose  . acetaminophen (TYLENOL) tablet 650 mg  650 mg Oral Q6H PRN Sela Hilding, MD   650 mg at 03/12/17 2002   Or  . acetaminophen (TYLENOL) suppository 650 mg  650 mg Rectal Q6H PRN Sela Hilding, MD      . Derrill Memo ON 03/14/2017] atorvastatin (LIPITOR) tablet 40 mg  40 mg Oral Daily Sela Hilding, MD      . clopidogrel (PLAVIX) tablet 75 mg  75 mg Oral Daily Patteson, Samuel A, NP   75 mg at 03/13/17 1334  . doxycycline (VIBRA-TABS) tablet 100 mg  100 mg Oral Q12H Sela Hilding, MD   100 mg at 03/13/17 1335  . insulin aspart (novoLOG) injection 0-9 Units  0-9 Units Subcutaneous TID WC Sela Hilding, MD   2 Units at 03/13/17 1335  . memantine (NAMENDA) tablet 5 mg  5 mg Oral BID Sela Hilding, MD   5 mg at 03/13/17 1103  . metoprolol tartrate (LOPRESSOR) tablet 12.5 mg  12.5 mg Oral BID Lockamy, Timothy, DO   12.5 mg at 03/13/17 1103     Discharge Medications: Please see discharge summary for a list of discharge medications.  Relevant Imaging Results:  Relevant Lab Results:   Additional Information SSN: [place], [place]

## 2017-03-13 NOTE — Progress Notes (Signed)
*  PRELIMINARY RESULTS* Vascular Ultrasound Carotid Duplex (Doppler) has been completed.  Preliminary findings: Bilateral: No significant (1-39%) ICA stenosis. Antegrade vertebral flow.     Landry Mellow, RDMS, RVT  03/13/2017, 3:25 PM

## 2017-03-13 NOTE — Consult Note (Signed)
Neurology Consultation Reason for Consult: Stroke Referring Physician: Gwendlyn Deutscher, K  CC: Generalized weakness  History is obtained from: Patient, chart review  HPI: Nathaniel Burgess is a 76 y.o. male who presented with generalized weakness and was felt to have left lower extremity cellulitis. He denies confusion or slurred speech. States his slurred speech is always there.   LKW: Unclear tpa given?: no, unclear time of onset, least 2 days    ROS: A 14 point ROS was performed and is negative except as noted in the HPI.   Past Medical History:  Diagnosis Date  . Chronic lymphocytic leukemia ([place])   . Diabetes mellitus   . Stroke [facility])      No family history on file.   Social History:  reports that he quit smoking about 8 years ago. His smoking use included Cigarettes. He has a 15.00 pack-year smoking history. He has never used smokeless tobacco. He reports that he does not drink alcohol or use drugs.   Exam: Current vital signs: BP 129/65 (BP Location: Left Arm)   Pulse 82   Temp 98.2 F (36.8 C) (Oral)   Resp 18   Ht 5\' 10"  (1.778 m)   Wt 69.4 kg (153 lb 1.6 oz)   SpO2 95%   BMI 21.97 kg/m  Vital signs in last 24 hours: Temp:  [98 F (36.7 C)-98.2 F (36.8 C)] 98.2 F (36.8 C) (09/04 2126) Pulse Rate:  [67-88] 82 (09/04 2126) Resp:  [16-28] 18 (09/04 2126) BP: (106-151)/(60-85) 129/65 (09/04 2126) SpO2:  [94 %-100 %] 95 % (09/04 2126) Weight:  [69.4 kg (153 lb 1.6 oz)] 69.4 kg (153 lb 1.6 oz) (09/04 1725)   Physical Exam  Constitutional: Appears well-developed and well-nourished.  Psych: Affect appropriate to situation Eyes: No scleral injection HENT: No OP obstrucion Head: Normocephalic.  Cardiovascular: Normal rate and regular rhythm.  Respiratory: Effort normal and breath sounds normal to anterior ascultation GI: Soft.  No distension. There is no tenderness.  Skin: WDI  Neuro: Mental Status: Patient is awake, alert, oriented to person, place, month,  year(Though he states"18..18...18.."  repeating the number 5 or 6 times. Patient appears mildly confused No signs of aphasia or neglect Cranial Nerves: II: Visual Fields are full. Pupils are equal, round, and reactive to light.   III,IV, VI: EOMI without ptosis or diploplia.  V: Facial sensation is symmetric to temperature VII: Facial movement is symmetric.  VIII: hearing is intact to voice X: Uvula elevates symmetrically XI: Shoulder shrug is symmetric. XII: tongue is midline without atrophy or fasciculations.  Motor: Tone is normal. Bulk is normal. He does not give good effort in any of his extremities,  Sensory: Sensation is symmetric to light touch and temperature in the arms and legs. Cerebellar: FNF and HKS are intact bilaterally  I have reviewed labs in epic and the results pertinent to this consultation are: CMP-unremarkable  I have reviewed the images obtained: MRI brain-periventricular white matter infarct.  Impression: 76 year old male with what I suspect is an infarct related to small vessel disease. It is unclear the chronicity of the SDH, but I would favor repeating CT to ensure they are not expanding.   Recommendations: 1. HgbA1c, fasting lipid panel 2. MRA  of the brain without contrast 3. Frequent neuro checks 4. Echocardiogram 5. Carotid dopplers 6. Prophylactic therapy-consider holding pending repeat imaging.  7. Risk factor modification 8. Telemetry monitoring 9. PT consult, OT consult, Speech consult 10. please page stroke NP  Or  PA  Or MD  from 8am -4 pm as this patient will be followed by the stroke team at this point.   You can look them up on [email or web address]      Roland Rack, MD [facility] [phone]  If 7pm- 7am, please page neurology on call as listed in [place].

## 2017-03-13 NOTE — Progress Notes (Signed)
[name] Medical [facility] Note Daily Progress Note Intern Pager: [phone]  Patient name: Nathaniel Burgess Medical record number: [id number] Date of birth: 1941/06/18 Age: 76 y.o. Gender: male  Primary Care Provider: Chevis Pretty, FNP Consultants: None Code Status: DNR  Pt Overview and Major Events to Date:  78/4: Generalized weakness and LLE erythema, found to have new acute stroke in corona radiata and subacute SDH x 2   Assessment and Plan: Nathaniel Knippenbergis a 76 y.o.malepresenting with weakness, left leg redness. PMH is significant for CLL, DM, HTN, 2 strokes-one as recent as May 2018 and CABG ~5 years ago per patient.  Generalized Weakness, acute R corona radiata stroke: CT head was negative for any acute findings, a 10mm nodule in the right parotid could be a lymph node or mass. 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 A1C 7.9, TSH 2.207, Cholesterol 162, HDL 27, LDL 110, Triglycerides 124. Mg 1.9. B12 370. Weakness likely due predominately to acute stroke given acute onset and above labs largely normal. MRI head with new acute stroke in corona radiata and subacute tiny SDH x 2. Neuro consulted. Slurred speech also improved from admission.  -neuro consulted, appreciate recs: Repeat head CT for expansion of subdural hematoma, MRA of brain w/o contrast -Neuro checks  -Carotid US -Echocardiogram -Telemetry -PT/OT/Speech consult -Aspirin held per neurology recommendations  Cellulitis of LLE, improving: Leukocytosis, 33.6 but does have CLL. Is afebrile. LLE erythema largely resolved. Given that patient developed LLE cellulitis while on Keflex, will cover for MRSA with PO doxy and observe for continued improvement on PO antiobiotics overnight.  -transition from vanc to PO doxy 100mg  BID -CBC pending  Hypertension: Home medication: Metoprolol Tartrate 25mg  tablet-Takes 1/2 pill (12.5mg ) 2x daily. Normotensive on admission. Hx of CABG and CAD (records elsewhere).   -restart metoprolol given CAD  Diabetes Mellitus: Home medications: Glipizide 5mg  BID before a meal, Glimepiride 2mg  QID with breakfast. A1C 7.9 -hold home medications -CBGs AC QHS -sensitive sliding scale  Hyperlipidemia: Home medications: Atorvastatin 20mg  QID. Cholesterol 162, HDL 27, LDL 110, Triglycerides 124 -increase Lipitor to 40mg   Vitamin D deficiency: Home medication Cholecalciferol 1000 units QID -Continue home vitamin D supplementation  Dementia: Home med is Namenda. Continue this as it can have rebound tachycardia when stopped.  -Continue Namenda  FEN/GI: SLIV, Heart diet PPx: no DVT ppx with SDH  Disposition: Pending PT/OT  Subjective:  Nathaniel Burgess did not have any acute overnight events. Without complaints this morning. I discussed with him and his son that he did have a small stroke and indicated to them that the stroke team would be seeing him today. Patient understood.  Objective: Temp:  [97.5 F (36.4 C)-98.2 F (36.8 C)] 97.5 F (36.4 C) (09/05 0610) Pulse Rate:  [61-88] 61 (09/05 0610) Resp:  [16-28] 18 (09/05 0610) BP: (106-151)/(56-85) 123/56 (09/05 0610) SpO2:  [94 %-100 %] 99 % (09/05 0610) Weight:  [153 lb 1.6 oz (69.4 kg)] 153 lb 1.6 oz (69.4 kg) (09/04 1725) Physical Exam: General: 76-year-old male, pleasant, in no acute distress Cardiovascular: Regular rate and rhythm, normal S1 and S2, no murmur, without rubs or gallops. DP 2+. Respiratory: Normal work of breath. Clear to auscultation bilaterally. Abdomen: Normal bowel sounds. Without tenderness to palpation. No masses. Extremities: Normal movement and tone.  Derm: Erythema of LLE has significantly reduced, mildly tender to palpation. No more erythema around IV site. Without rash.  Neurologic: Alert and oriented x 3. Cranial nerves grossly intact. Strength 4/5 RUE, 5/5 in all  other extremities. Sensation intact in all extremities.  Laboratory:  Recent Labs Lab 03/12/17 1152  WBC  33.6*  HGB 12.7*  HCT 38.8*  PLT 121*    Recent Labs Lab 03/12/17 1152  NA 136  K 4.3  CL 103  CO2 25  BUN 21*  CREATININE 1.08  CALCIUM 9.0  PROT 5.7*  BILITOT 1.7*  ALKPHOS 134*  ALT 22  AST 22  GLUCOSE 97    Imaging/Diagnostic Tests: Ct Head Wo Contrast  Result Date: 03/13/2017 CLINICAL DATA:  Followup stroke.  Left-sided weakness. EXAM: CT HEAD WITHOUT CONTRAST TECHNIQUE: Contiguous axial images were obtained from the base of the skull through the vertex without intravenous contrast. COMPARISON:  MRI 03/12/2017.  CT 03/12/2017 FINDINGS: Brain: Generalized atrophy. Chronic small-vessel ischemic changes throughout the cerebral hemispheric deep white matter. Subacute infarctions seen in the left corona radiata by MRI not specifically differentiated by this CT. No sign of new infarction. Small amount of subdural blood adjacent to the posteromedial falx on the left as seen by previous MR, without enlargement. Small amount of subdural blood along the right convexity seen by MRI cannot be resolved by this CT. No evidence of hydrocephalus. No mass lesion. Vascular: There is atherosclerotic calcification of the major vessels at the base of the brain. Skull: Negative Sinuses/Orbits: Clear/normal Other: None significant IMPRESSION: No change by CT. Chronic small-vessel ischemic changes of the white matter. Subacute infarction left corona radiata shown by MRI difficult to specifically delineate by CT. No new finding. Small amount of subdural blood at the left posterior inferior falx and superior tentorium is unchanged. Small amount of subdural blood along the right convexity visible by MRI not visible by CT. Electronically Signed   By: Nelson Chimes M.D.   On: 03/13/2017 10:43   Ct Head Wo Contrast  Result Date: 03/12/2017 CLINICAL DATA:  Focal neuro deficit.  Generalized weakness today. EXAM: CT HEAD WITHOUT CONTRAST TECHNIQUE: Contiguous axial images were obtained from the base of the skull  through the vertex without intravenous contrast. COMPARISON:  None. FINDINGS: Brain: No evidence of acute infarction, hemorrhage, hydrocephalus, extra-axial collection or mass lesion/mass effect. Generalized atrophy, moderate for age. Chronic microvascular ischemia in the cerebral white matter including remote small vessel infarct in the left corona radiata. Vascular: Atherosclerotic calcification.  No hyperdense vessel. Skull: No acute or aggressive finding Sinuses/Orbits: No acute finding Other: There is a 12 mm (craniocaudal) nodule in the right parotid which could be mass or lymph node. IMPRESSION: 1. No acute finding. 2. Moderate atrophy and chronic microvascular ischemia. 3. 12 mm nodule in the right parotid that could be lymph node or mass. Per chart the patient has CLL, and this could be related. Electronically Signed   By: Monte Fantasia M.D.   On: 03/12/2017 12:31   Mr Virgel Paling IR Contrast  Result Date: 03/13/2017 CLINICAL DATA:  76 year old male with ataxia and confusion found to have acute on chronic ischemia in the left corona radiata and small bilateral subdural hematomas on brain MRI yesterday. EXAM: MRA HEAD WITHOUT CONTRAST TECHNIQUE: Angiographic images of the Circle of Willis were obtained using MRA technique without intravenous contrast. COMPARISON:  Brain MRI 03/12/2017. FINDINGS: Trace bilateral subdural hematomas appear stable. No intracranial mass effect. Antegrade flow in the posterior circulation with dominant distal right vertebral artery which primarily supplies the basilar. No distal left vertebral artery stenosis identified, the vessel functionally terminates in the left PICA origin which remains patent. Dominant appearing right AICA origin is patent. Basilar  artery irregularity without stenosis. SCA and PCA origins are normal. Posterior communicating arteries are diminutive or absent. Bilateral PCA branches are patent with mild irregularity. Antegrade flow in both ICA siphons. Mild  siphon ectasia without stenosis. Normal ophthalmic artery origins. Patent carotid termini. Normal MCA and ACA origins. Anterior communicating artery and visible ACA branches are normal. Right MCA M1 segment, bifurcation, and visible right MCA branches are normal. Left MCA M1 segment and bifurcation are patent. Visible left MCA branches are within normal limits. IMPRESSION: 1. No circle of Willis branch occlusion identified. No hemodynamically significant large vessel or proximal arterial stenosis. 2. Posterior circulation atherosclerosis affecting the basilar artery and distal PCA branches without significant stenosis. 3. Trace bilateral subdural hematomas are again evident and appear stable since yesterday. Electronically Signed   By: Genevie Ann M.D.   On: 03/13/2017 11:07   Mr Brain Wo Contrast  Result Date: 03/12/2017 CLINICAL DATA:  Ataxia. Increased confusion. History of stroke with residual right leg weakness. Frequent falls. EXAM: MRI HEAD WITHOUT CONTRAST TECHNIQUE: Multiplanar, multiecho pulse sequences of the brain and surrounding structures were obtained without intravenous contrast. COMPARISON:  Head CT 03/12/2017 FINDINGS: Brain: There is a chronic lacunar infarct posteriorly in the left corona radiata. A small amount of restricted diffusion at this level is consistent with a superimposed small acute infarct, predominantly anteriorly to the chronic infarct. There is trace subdural blood in the left occipital region partially extending along the tentorium. A trace amount of focal subdural blood is noted over the right temporoparietal convexity. There is no associated mass effect. There is moderate cerebral atrophy. A thin rim of confluent periventricular white matter T2 hyperintensity and scattered small subcortical white matter foci are nonspecific but compatible with mild chronic small vessel ischemic disease. A subcentimeter T2 hyperintense focus in the inferomedial right thalamus/ superior cerebral  peduncle is without significant surrounding gliosis and more suggestive of a dilated perivascular space rather than chronic lacunar infarct. No mass or midline shift is seen. Vascular: Major intracranial vascular flow voids are preserved. Skull and upper cervical spine: No suspicious marrow lesion. Sinuses/Orbits: Unremarkable orbits.  No significant sinus disease. Other: 13 mm superficial right parotid nodule as reported on CT. IMPRESSION: 1. Acute on chronic ischemia in the left corona radiata. 2. Tiny bilateral subdural hematomas.  No mass effect. 3. Mild chronic small vessel ischemic disease and moderate cerebral atrophy. Critical Value/emergent results were called by telephone at the time of interpretation on 03/12/2017 at 7:41 pm to Dr. Garlan Fillers, who verbally acknowledged these results. Electronically Signed   By: Logan Bores M.D.   On: 03/12/2017 19:51   Dg Chest Port 1 View  Result Date: 03/12/2017 CLINICAL DATA:  Weakness and dizziness today. EXAM: PORTABLE CHEST 1 VIEW COMPARISON:  None. FINDINGS: The heart is normal in size. Mild tortuosity and calcification of the thoracic aorta. The pulmonary hila appear normal. Surgical changes from bypass surgery are noted. Mild eventration of the right hemidiaphragm. The lungs are clear. No infiltrates or effusions. The bony thorax is intact. IMPRESSION: No acute cardiopulmonary findings. Electronically Signed   By: Marijo Sanes M.D.   On: 03/12/2017 12:41     Ophelia Charter, MS4  FPTS Upper-Level Resident Addendum  I have independently interviewed and examined the patient. I have discussed the above with the original author and agree with their documentation. My edits for correction/addition/clarification are in blue. Please see also any attending notes.   Ralene Ok, MD PGY-2, [place] Service pager:  [phone] (text pages welcome through [facility])

## 2017-03-13 NOTE — Evaluation (Signed)
Occupational Therapy Evaluation Patient Details Name: Nathaniel Burgess MRN: [id number] DOB: 12/23/1940 Today's Date: 03/13/2017    History of Present Illness Nathaniel Burgess a 76 y.o.malepresenting with generalized weakness, increased slurred speech, and confusion. 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 There is also a complaint of LLE erythema that is warm. He is currently being treated with keflex for right arm cellulitis that was treated for on 8/25 with a 10 day course. Patient admits to more frequent falls within the last 6 weeks.   Clinical Impression   Pt presented supine in bed, pleasant and willing to work with therapy. Pt unreliable historian as he gave multiple answers for how much assist he was getting for ADL PTA. Pt is currently mod A for ADL and mod A for stand pivot transfers this session. Pt requiring multi modal cues for sequencing and problem solving during ADL activities and decreased safety awareness/deficits. Pt will benefit from skilled OT in the acute setting and afterwards at SNF level to maximize safety and independence in ADL.Next session to focus on energy conservation.     Follow Up Recommendations  SNF;Supervision/Assistance - 24 hour    Equipment Recommendations  Tub/shower seat    Recommendations for Other Services       Precautions / Restrictions Precautions Precautions: Fall Restrictions Weight Bearing Restrictions: No      Mobility Bed Mobility Overal bed mobility: Needs Assistance Bed Mobility: Rolling;Supine to Sit Rolling: Min assist   Supine to sit: Min assist     General bed mobility comments: min assist to power up to upright  Transfers Overall transfer level: Needs assistance Equipment used: Rolling walker (2 wheeled) Transfers: Sit to/from Stand Sit to Stand: Mod assist         General transfer comment: moderate assist for  stability to power up to standing, patient with modified positioning of hands on RW to power up    Balance Overall balance assessment: Needs assistance;History of Falls Sitting-balance support: Feet supported Sitting balance-Leahy Scale: Poor Sitting balance - Comments: left lateral LOB Postural control: Left lateral lean Standing balance support: During functional activity;Bilateral upper extremity supported Standing balance-Leahy Scale: Poor Standing balance comment: reliance on UE support                           ADL either performed or assessed with clinical judgement   ADL Overall ADL's : Needs assistance/impaired Eating/Feeding: Sitting;Set up   Grooming: Wash/dry hands;Wash/dry face;Oral care;Set up;Minimal assistance;Sitting Grooming Details (indicate cue type and reason): in recliner, verbal cues for sequencing, problem solving, and fine motor coordination Upper Body Bathing: Moderate assistance   Lower Body Bathing: Maximal assistance   Upper Body Dressing : Moderate assistance;Sitting Upper Body Dressing Details (indicate cue type and reason): to hospital gown like robe Lower Body Dressing: Moderate assistance;Sitting/lateral leans Lower Body Dressing Details (indicate cue type and reason): LOB to left when attempting to don socks, A required to maintain sitting Toilet Transfer: Moderate assistance;Stand-pivot;RW Toilet Transfer Details (indicate cue type and reason): simulated through recliner transfer     Tub/ Shower Transfer: Moderate assistance;Stand-pivot   Functional mobility during ADLs: Moderate assistance;+2 for safety/equipment;Rolling walker General ADL Comments: increased dependence due to RUE deficits     Vision Baseline Vision/History: Wears glasses Wears Glasses: Reading only Patient Visual Report: No change from baseline  Perception     Praxis      Pertinent Vitals/Pain Pain Assessment: No/denies pain     Hand Dominance  Right   Extremity/Trunk Assessment Upper Extremity Assessment Upper Extremity Assessment: RUE deficits/detail RUE Deficits / Details: generalized 3/5 strength for UE RUE Sensation:  (WFL) RUE Coordination: decreased fine motor;decreased gross motor   Lower Extremity Assessment Lower Extremity Assessment: Defer to PT evaluation       Communication Communication Communication: Expressive difficulties   Cognition Arousal/Alertness: Awake/alert   Overall Cognitive Status: No family/caregiver present to determine baseline cognitive functioning Area of Impairment: Following commands;Awareness;Safety/judgement;Problem solving;Memory                     Memory: Decreased short-term memory Following Commands: Follows one step commands with increased time   Awareness: Emergent Problem Solving: Slow processing;Decreased initiation;Requires verbal cues;Requires tactile cues General Comments: poor historian   General Comments  no family present during session, wife called to check on him and was asked to call back to speak to Pt directly    Exercises     Shoulder Instructions      Home Living Family/patient expects to be discharged to:: Private residence Living Arrangements: Children;Other relatives   Type of Home: House Home Access: Stairs to enter [employer] of Steps: 3 Entrance Stairs-Rails: Can reach both Home Layout: Two level;Able to live on main level with bedroom/bathroom     Bathroom Shower/Tub: Occupational psychologist: Standard     Home Equipment: Environmental consultant - 2 wheels;Cane - single point;Bedside commode;Shower seat - built in;Wheelchair - manual;Hospital bed   Additional Comments: Would like to get confirmation from family about       Prior Functioning/Environment Level of Independence: Needs assistance  Gait / Transfers Assistance Needed: uses walker to get around with     Comments: questionable historian        OT Problem  List: Decreased strength;Decreased range of motion;Impaired balance (sitting and/or standing);Decreased cognition;Decreased safety awareness;Decreased knowledge of use of DME or AE;Decreased knowledge of precautions;Impaired UE functional use      OT Treatment/Interventions: Self-care/ADL training;Therapeutic exercise;Neuromuscular education;Energy conservation;DME and/or AE instruction;Therapeutic activities;Cognitive remediation/compensation;Patient/family education;Balance training    OT Goals(Current goals can be found in the care plan section) Acute Rehab OT Goals Patient Stated Goal: none stated ADL Goals Pt Will Perform Grooming: with set-up;sitting Pt Will Perform Upper Body Bathing: with set-up;sitting;with adaptive equipment Pt Will Perform Lower Body Bathing: with set-up;with adaptive equipment;sitting/lateral leans Pt Will Transfer to Toilet: with min guard assist;stand pivot transfer;bedside commode (with RW; caregiver independent in assisting) Pt Will Perform Toileting - Clothing Manipulation and hygiene: with supervision;sitting/lateral leans Additional ADL Goal #1: Pt will perform bed mobility at supervision level prior to starting ADL activity Additional ADL Goal #2: Pt will recall 3 ways of conserving energy during ADL with 2 or less verbal cues  OT Frequency: Min 2X/week   Barriers to D/C:    unsure of support at home       Co-evaluation PT/OT/SLP Co-Evaluation/Treatment: Yes Reason for Co-Treatment: Complexity of the patient's impairments (multi-system involvement);Necessary to address cognition/behavior during functional activity;For patient/therapist safety;To address functional/ADL transfers   OT goals addressed during session: ADL's and self-care      AM-PAC PT "6 Clicks" Daily Activity     Outcome Measure Help from another person eating meals?: A Little Help from another person taking care of personal grooming?: A Lot Help from another person toileting, which  includes using toliet, bedpan,  or urinal?: A Lot Help from another person bathing (including washing, rinsing, drying)?: A Lot Help from another person to put on and taking off regular upper body clothing?: A Lot Help from another person to put on and taking off regular lower body clothing?: A Lot 6 Click Score: 13   End of Session Equipment Utilized During Treatment: Gait belt Nurse Communication: Mobility status  Activity Tolerance: Patient tolerated treatment well Patient left: in chair;with chair alarm set;with call bell/phone within reach  OT Visit Diagnosis: Unsteadiness on feet (R26.81);Other abnormalities of gait and mobility (R26.89);Repeated falls (R29.6);History of falling (Z91.81);Muscle weakness (generalized) (M62.81);Hemiplegia and hemiparesis;Other symptoms and signs involving cognitive function Hemiplegia - Right/Left: Right Hemiplegia - dominant/non-dominant: Dominant Hemiplegia - caused by: Cerebral infarction                Time: 5366-4403 OT Time Calculation (min): 23 min Charges:  OT General Charges $OT Visit: 1 Visit OT Evaluation $OT Eval Moderate Complexity: 1 Mod G-Codes:     Hulda Humphrey OTR/L [facility] 03/13/2017, 3:03 PM

## 2017-03-13 NOTE — Evaluation (Signed)
Physical Therapy Evaluation Patient Details Name: Nathaniel Burgess MRN: [id number] DOB: [birth year] Today's Date: 03/13/2017   History of Present Illness  [name] a 76 y.o.malepresenting with generalized weakness, increased slurred speech, and confusion. 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 There is also a complaint of LLE erythema that is warm. He is currently being treated with keflex for right arm cellulitis that was treated for on 8/25 with a 10 day course. Patient admits to more frequent falls within the last 6 weeks.  Clinical Impression  Orders received for PT evaluation. Patient demonstrates deficits in functional mobility as indicated below. Will benefit from continued skilled PT to address deficits and maximize function. Will see as indicated and progress as tolerated.  Patient significantly poor historian, unable to provide full history and provided contradicting responses regarding level of care at home. At this time, feel patient would benefit from post acute rehabilitation.     Follow Up Recommendations SNF;Supervision/Assistance - 24 hour    Equipment Recommendations  None recommended by PT    Recommendations for Other Services       Precautions / Restrictions Precautions Precautions: Fall Restrictions Weight Bearing Restrictions: No      Mobility  Bed Mobility Overal bed mobility: Needs Assistance Bed Mobility: Rolling;Supine to Sit Rolling: Min assist   Supine to sit: Min assist     General bed mobility comments: min assist to power up to upright  Transfers Overall transfer level: Needs assistance Equipment used: Rolling walker (2 wheeled) Transfers: Sit to/from Stand Sit to Stand: Mod assist         General transfer comment: moderate assist for stability to power up to standing, patient with modified positioning of hands on RW to power  up  Ambulation/Gait Ambulation/Gait assistance: Min assist Ambulation Distance (Feet): 4 Feet Assistive device: Rolling walker (2 wheeled) Gait Pattern/deviations: Step-to pattern;Decreased stride length;Decreased step length - right;Shuffle;Decreased dorsiflexion - right Gait velocity: decreased Gait velocity interpretation: <1.8 ft/sec, indicative of risk for recurrent falls General Gait Details: patient with inability to advance RLE  without assist due to weakness and instability of LLE,   Stairs            Wheelchair Mobility    Modified Rankin (Stroke Patients Only) Modified Rankin (Stroke Patients Only) Pre-Morbid Rankin Score: Moderately severe disability Modified Rankin: Severe disability     Balance Overall balance assessment: Needs assistance;History of Falls Sitting-balance support: Feet supported Sitting balance-Leahy Scale: Poor Sitting balance - Comments: left lateral LOB Postural control: Left lateral lean Standing balance support: During functional activity;Bilateral upper extremity supported Standing balance-Leahy Scale: Poor Standing balance comment: reliance on UE support                             Pertinent Vitals/Pain Pain Assessment: No/denies pain    Home Living Family/patient expects to be discharged to:: Private residence Living Arrangements: Children;Other relatives   Type of Home: House Home Access: Stairs to enter Entrance Stairs-Rails: Can reach both Entrance Stairs-Number of Steps: 3 Home Layout: Two level;Able to live on main level with bedroom/bathroom Home Equipment: Gilford Rile - 2 wheels;Cane - single point;Bedside commode;Shower seat - built in;Wheelchair - manual;Hospital bed      Prior Function Level of Independence: Needs assistance   Gait / Transfers Assistance Needed: uses walker to get around with  Comments: questionable historian     Hand Dominance   Dominant Hand: Right    Extremity/Trunk  Assessment        Lower Extremity Assessment Lower Extremity Assessment: RLE deficits/detail;LLE deficits/detail RLE Deficits / Details: Noted RLE deficits, wekaness 3-/5 gross motions RLE Coordination: decreased fine motor;decreased gross motor LLE Deficits / Details: noted weakness LLE (3/5) but with improved proximal strength compared to RLE LLE Coordination: decreased fine motor;decreased gross motor       Communication   Communication: Expressive difficulties  Cognition Arousal/Alertness: Awake/alert   Overall Cognitive Status: No family/caregiver present to determine baseline cognitive functioning Area of Impairment: Following commands;Awareness;Safety/judgement;Problem solving;Memory                     Memory: Decreased short-term memory Following Commands: Follows one step commands with increased time   Awareness: Emergent Problem Solving: Slow processing;Decreased initiation;Requires verbal cues;Requires tactile cues General Comments: poor historian      General Comments      Exercises     Assessment/Plan    PT Assessment Patient needs continued PT services  PT Problem List Decreased strength;Decreased activity tolerance;Decreased balance;Decreased mobility;Decreased coordination;Decreased cognition;Decreased safety awareness       PT Treatment Interventions DME instruction;Gait training;Stair training;Functional mobility training;Therapeutic activities;Therapeutic exercise;Balance training;Neuromuscular re-education;Patient/family education    PT Goals (Current goals can be found in the Care Plan section)  Acute Rehab PT Goals Patient Stated Goal: none stated PT Goal Formulation: With patient Time For Goal Achievement: 03/27/17 Potential to Achieve Goals: Fair    Frequency Min 3X/week   Barriers to discharge        Co-evaluation PT/OT/SLP Co-Evaluation/Treatment: Yes Reason for Co-Treatment: For patient/therapist safety;Necessary to  address cognition/behavior during functional activity;Complexity of the patient's impairments (multi-system involvement) PT goals addressed during session: Mobility/safety with mobility;Balance OT goals addressed during session: ADL's and self-care       AM-PAC PT "6 Clicks" Daily Activity  Outcome Measure Difficulty turning over in bed (including adjusting bedclothes, sheets and blankets)?: Unable Difficulty moving from lying on back to sitting on the side of the bed? : Unable Difficulty sitting down on and standing up from a chair with arms (e.g., wheelchair, bedside commode, etc,.)?: Unable Help needed moving to and from a bed to chair (including a wheelchair)?: A Little Help needed walking in hospital room?: A Lot Help needed climbing 3-5 steps with a railing? : A Lot 6 Click Score: 10    End of Session Equipment Utilized During Treatment: Gait belt Activity Tolerance: Patient tolerated treatment well Patient left: in chair;with call bell/phone within reach;with chair alarm set Nurse Communication: Mobility status PT Visit Diagnosis: Unsteadiness on feet (R26.81);Muscle weakness (generalized) (M62.81);History of falling (Z91.81)    Time: 1030-1054 PT Time Calculation (min) (ACUTE ONLY): 24 min   Charges:   PT Evaluation $PT Eval Moderate Complexity: 1 Mod     PT G Codes:        Nathaniel Burgess, PT DPT  Board Certified Neurologic Specialist [place] 03/13/2017, 1:25 PM

## 2017-03-14 ENCOUNTER — Inpatient Hospital Stay (HOSPITAL_COMMUNITY): Payer: Medicare Other

## 2017-03-14 LAB — ECHOCARDIOGRAM COMPLETE
HEIGHTINCHES: 70 in
Weight: 2449.6 oz

## 2017-03-14 LAB — GLUCOSE, CAPILLARY
GLUCOSE-CAPILLARY: 165 mg/dL — AB (ref 65–99)
GLUCOSE-CAPILLARY: 179 mg/dL — AB (ref 65–99)
Glucose-Capillary: 264 mg/dL — ABNORMAL HIGH (ref 65–99)
Glucose-Capillary: 305 mg/dL — ABNORMAL HIGH (ref 65–99)

## 2017-03-14 LAB — FOLATE RBC
FOLATE, HEMOLYSATE: 305.9 ng/mL
Folate, RBC: 831 ng/mL (ref >498)
Hematocrit: 36.8 % — ABNORMAL LOW (ref 37.5–51.0)

## 2017-03-14 MED ORDER — INSULIN ASPART 100 UNIT/ML ~~LOC~~ SOLN
5.0000 [IU] | Freq: Once | SUBCUTANEOUS | Status: AC
  Administered 2017-03-14: 5 [IU] via SUBCUTANEOUS

## 2017-03-14 NOTE — Progress Notes (Signed)
STROKE TEAM PROGRESS NOTE   HISTORY OF PRESENT ILLNESS (per record) Nathaniel Burgess is a 76 y.o. male with a history of CLL, DM2, and prior CVAs who presented with generalized weakness and was felt to have left lower extremity cellulitis. He denies confusion or slurred speech. States his slurred speech is an old deficit.    LKW: Unclear  Patient was not administered IV t-PA secondary to arriving outside of the tPA treatment window. He was admitted to General neurology for further evaluation and treatment.   SUBJECTIVE (INTERVAL HISTORY) His  Son is in the rooml.  The pt is alert, oriented, and follows all commands appropriately.He states he has had prior stroke son aspirin 81 mg and now on aspirin 325 mg . He has never tried plavix.    OBJECTIVE Temp:  [97.6 F (36.4 C)-98.7 F (37.1 C)] 97.6 F (36.4 C) (09/06 1457) Pulse Rate:  [71-95] 72 (09/06 1457) Cardiac Rhythm: Normal sinus rhythm;Bundle branch block (09/06 0700) Resp:  [18] 18 (09/06 0528) BP: (128-152)/(51-84) 128/64 (09/06 1457) SpO2:  [97 %-100 %] 97 % (09/06 1457)  CBC:   Recent Labs Lab 03/12/17 1152 03/12/17 2125 03/13/17 1237  WBC 33.6*  --  35.0*  HGB 12.7*  --  13.2  HCT 38.8* 36.8* 40.5  MCV 86.6  --  87.9  PLT 121*  --  128*    Basic Metabolic Panel:   Recent Labs Lab 03/12/17 1152 03/12/17 2125 03/13/17 1237  NA 136  --  139  K 4.3  --  4.4  CL 103  --  103  CO2 25  --  25  GLUCOSE 97  --  155*  BUN 21*  --  20  CREATININE 1.08  --  1.08  CALCIUM 9.0  --  8.6*  MG  --  1.9  --     Lipid Panel:     Component Value Date/Time   CHOL 162 03/12/2017 2125   TRIG 124 03/12/2017 2125   HDL 27 (L) 03/12/2017 2125   CHOLHDL 6.0 03/12/2017 2125   VLDL 25 03/12/2017 2125   LDLCALC 110 (H) 03/12/2017 2125   HgbA1c:  Lab Results  Component Value Date   HGBA1C 7.9 (H) 03/12/2017   Urine Drug Screen: No results found for: LABOPIA, COCAINSCRNUR, LABBENZ, AMPHETMU, THCU, LABBARB  Alcohol  Level No results found for: ETH  IMAGING  Ct Head Wo Contrast 03/13/2017 IMPRESSION: No change by CT. Chronic small-vessel ischemic changes of the white matter. Subacute infarction left corona radiata shown by MRI difficult to specifically delineate by CT. No new finding. Small amount of subdural blood at the left posterior inferior falx and superior tentorium is unchanged. Small amount of subdural blood along the right convexity visible by MRI not visible by CT.  Ct Head Wo Contrast 03/12/2017 IMPRESSION: 1. No acute finding. 2. Moderate atrophy and chronic microvascular ischemia. 3. 12 mm nodule in the right parotid that could be lymph node or mass. Per chart the patient has CLL, and this could be related.  Nathaniel Burgess Head Wo Contrast 03/13/2017 IMPRESSION: 1. No circle of Willis branch occlusion identified. No hemodynamically significant large vessel or proximal arterial stenosis. 2. Posterior circulation atherosclerosis affecting the basilar artery and distal PCA branches without significant stenosis. 3. Trace bilateral subdural hematomas are again evident and appear stable since yesterday.  Nathaniel Brain Wo Contrast 03/12/2017 IMPRESSION: 1. Acute on chronic ischemia in the left corona radiata. 2. Tiny bilateral subdural hematomas.  No mass effect. 3.  Mild chronic small vessel ischemic disease and moderate cerebral atrophy.  Dg Chest Port 1 View 03/12/2017 IMPRESSION: No acute cardiopulmonary findings.   Carotid US 03/13/2017 B ICA 1-39% stenosis, VAs antegrade  TTE 03/13/2017 Left ventricle: The cavity size was normal. Wall thickness was   increased in a pattern of mild LVH. Systolic function was normal.   Wall motion was normal; there were no regional wall motion   abnormalities  TEE 03/15/2017  pending    PHYSICAL EXAM Obese elderly Caucasian male not in distress. . Afebrile. Head is nontraumatic. Neck is supple without bruit.    Cardiac exam no murmur or gallop. Lungs are clear to  auscultation. Distal pulses are well felt. Neurological Exam ;  Awake  Alert oriented x 3. Normal speech and language.Mildly diminished attention and recall.eye movements full without nystagmus.fundi were not visualized. Vision acuity and fields appear normal. Hearing is normal. Palatal movements are normal. Face symmetric. Tongue midline. Normal strength, tone, reflexes and coordination. Normal sensation. Gait deferred.  ASSESSMENT/PLAN Nathaniel Burgess is a 76 y.o. male with history of CLL, DM2, and prior CVAs who presented with generalized weakness and was felt to have left lower extremity cellulitis. He denies confusion or slurred speech. States his slurred speech is an old deficit.  He did not receive IV t-PA due to arriving outside of the tPA treatment window.   Stroke:  Acute on chronic ischemia in the left corona radiata with tiny bilateral subdural hematomas in the setting of CLL.  Incidental finding of 12 mm nodule in the right parotid that could be lymph node or mass.  Resultant  No focal deficits  CT head: no acute stroke  MRI head:  Acute on chronic ischemia in the left corona radiata with tiny bilateral subdural hematomas in the setting of CLL  MRA head: no LVO or high-grade stenosis  Carotid Doppler: B ICA 1-39% stenosis, VAs antegrade  2D Echo   pending   LDL 110  HgbA1c 7.9  SCDs for VTE prophylaxis Diet heart healthy/carb modified Room service appropriate? Yes; Fluid consistency: Thin  aspirin 325 mg daily prior to admission, now on clopidogrel 75 mg daily  Patient counseled to be compliant with his antithrombotic medications  Ongoing aggressive stroke risk factor management  Therapy recommendations: SNF;Supervision/Assistance - 24 hour   Disposition: pending  Hyperlipidemia  Home meds: atorvastatin 20 mg PO daily  LDL 110, goal < 70  Increase to atorvastatin 40 mg PO daily  Continue statin at discharge  Diabetes  HgbA1c 7.9, goal <  7.0  Uncontrolled  Other Stroke Risk Factors  Advanced age  Hx stroke/TIA  CLL  Other Active Problems Small incidental subdural hematomas on MRI -less conspicuous on CT  Hospital day # 2 I have personally examined this patient, reviewed notes, independently viewed imaging studies, participated in medical decision making and plan of care.ROS completed by me personally and pertinent positives fully documented  I have made any additions or clarifications directly to the above note. He has presented with clinically silent acute on chronic lacunar infarct and has failed aspirin 81 as well as 325 mg daily doses . He has CLL and tiny subdurals but is at high risk for recurrent strokes and will benefit with antiplatelet therapy as he has had more than 1 stroke. He will need close neurological monitoring on Plavix and may need repeat imaging if he complains of headaches or new focal neurological problems. Explained risk of tiny subdurals worsening on Plavix versus recurrent stroke  risk of 10-15% in first year and feel he will benefit from careful trial of Plavix as he has failed aspirin twice already as well as aggressive risk factor modification. D/w patient and son who agrees with plan.Greater than 50% time during this 25 minute visit were spent on counselling and ccordination of care about his stroke, subdural and answering questions.  Antony Contras, MD Medical Director [facility] Pager: [phone] 03/14/2017 4:36 PM   To contact Stroke Continuity provider, please refer to [email or web address] After hours, contact General Neurology

## 2017-03-14 NOTE — Progress Notes (Signed)
Inpatient Diabetes Program Recommendations  AACE/ADA: New Consensus Statement on Inpatient Glycemic Control (2015)  Target Ranges:  Prepandial:   less than 140 mg/dL      Peak postprandial:   less than 180 mg/dL (1-2 hours)      Critically ill patients:  140 - 180 mg/dL   Lab Results  Component Value Date   GLUCAP 165 (H) 03/14/2017   HGBA1C 7.9 (H) 03/12/2017    Review of Glycemic Control  Results for Nathaniel Burgess, ESKEW (MRN [id number]) as of 03/14/2017 12:18  Ref. Range 03/13/2017 12:23 03/13/2017 17:12 03/13/2017 21:18 03/14/2017 07:52 03/14/2017 11:30  Glucose-Capillary Latest Ref Range: 65 - 99 mg/dL 165 (H) 197 (H) 208 (H) 165 (H) 264 (H)     Diabetes history: Type 2 Outpatient Diabetes medications: Amaryl 2mg  qam, Glipizide 10mg  qday Current orders for Inpatient glycemic control: Novolog 0-9 units tid  Inpatient Diabetes Program Recommendations:  Please consider adding Novolog 2 units tid with meals- post prandial blood sugars elevated despite current correction insulin.  This will ensure he gets some insulin at all meals as long as he eats 50%.    Continue Novolog 0-9 units tid as ordered.   Please reassess diabetes medications at discharge- appears to be taking both Amaryl and Glipizide.    Gentry Fitz, RN, BA, MHA, CDE Diabetes Coordinator Inpatient Diabetes Program  [phone] (Team Pager) [phone] ([place]) 03/14/2017 12:24 PM   .

## 2017-03-14 NOTE — Progress Notes (Signed)
Physical Therapy Treatment Patient Details Name: Nathaniel Burgess MRN: [id number] DOB: 12/14/1940 Today's Date: 03/14/2017    History of Present Illness [name] a 76 y.o.malepresenting with generalized weakness, increased slurred speech, and confusion. 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 There is also a complaint of LLE erythema that is warm. He is currently being treated with keflex for right arm cellulitis that was treated for on 8/25 with a 10 day course. Patient admits to more frequent falls within the last 6 weeks.    PT Comments    Pt making steady improvements.  Emphasis today on gait stability and quality as well as pre-gait activity that might strength aspects of gait.    Follow Up Recommendations  SNF;Supervision/Assistance - 24 hour     Equipment Recommendations  None recommended by PT    Recommendations for Other Services       Precautions / Restrictions Precautions Precautions: Fall    Mobility  Bed Mobility Overal bed mobility: Needs Assistance Bed Mobility: Supine to Sit;Sit to Supine     Supine to sit: Min assist Sit to supine: Min assist   General bed mobility comments: up via R elbow and min truncal assist and minimal R LE assist back into bed..  Assist and cues to get positioned in bed.  Transfers Overall transfer level: Needs assistance Equipment used: Rolling walker (2 wheeled) Transfers: Sit to/from Stand Sit to Stand: Min assist         General transfer comment: assist to come forward more than boost up.  Ambulation/Gait Ambulation/Gait assistance: Min assist;Mod assist;+2 safety/equipment Ambulation Distance (Feet): 90 Feet Assistive device: Rolling walker (2 wheeled) Gait Pattern/deviations: Step-to pattern;Decreased stride length;Decreased step length - right;Decreased stance time - right;Narrow base of support Gait  velocity: decreased Gait velocity interpretation: Below normal speed for age/gender General Gait Details: pt able to advance leg, but with ataxia and inability to place foot in a specific place, tending to touch or step on L foot..  Cuing help pt place foot further in front of L LE, but pt unable to widen stance without facilitation from the therapist.  As pt fatigue the advancement to the R foot degaded markedly and pt need mod assist and a 2nd person for safety.   Stairs            Wheelchair Mobility    Modified Rankin (Stroke Patients Only) Modified Rankin (Stroke Patients Only) Modified Rankin: Moderately severe disability     Balance Overall balance assessment: Needs assistance Sitting-balance support: Feet supported Sitting balance-Leahy Scale: Fair Sitting balance - Comments: pt could w/shift L and R without LOB.     Standing balance-Leahy Scale: Poor Standing balance comment: reliance on UE support                            Cognition Arousal/Alertness: Awake/alert Behavior During Therapy: WFL for tasks assessed/performed Overall Cognitive Status:  (NT formally)                                        Exercises      General Comments        Pertinent Vitals/Pain Pain Assessment: No/denies pain    Home Living  Prior Function            PT Goals (current goals can now be found in the care plan section) Acute Rehab PT Goals Patient Stated Goal: none stated PT Goal Formulation: With patient Time For Goal Achievement: 03/27/17 Potential to Achieve Goals: Fair Progress towards PT goals: Progressing toward goals    Frequency    Min 3X/week      PT Plan Current plan remains appropriate    Co-evaluation              AM-PAC PT "6 Clicks" Daily Activity  Outcome Measure  Difficulty turning over in bed (including adjusting bedclothes, sheets and blankets)?: Unable Difficulty moving  from lying on back to sitting on the side of the bed? : Unable Difficulty sitting down on and standing up from a chair with arms (e.g., wheelchair, bedside commode, etc,.)?: Unable Help needed moving to and from a bed to chair (including a wheelchair)?: A Little Help needed walking in hospital room?: A Lot Help needed climbing 3-5 steps with a railing? : A Lot 6 Click Score: 10    End of Session Equipment Utilized During Treatment: Gait belt Activity Tolerance: Patient tolerated treatment well Patient left: in bed;with call bell/phone within reach;with bed alarm set;with family/visitor present Nurse Communication: Mobility status PT Visit Diagnosis: Unsteadiness on feet (R26.81);Muscle weakness (generalized) (M62.81);History of falling (Z91.81)     Time: 6286-3817 PT Time Calculation (min) (ACUTE ONLY): 30 min  Charges:  $Gait Training: 8-22 mins $Therapeutic Activity: 8-22 mins                    G Codes:       April 12, 2017  Donnella Sham, PT [phone]  (pager)   Tessie Fass Cressie Betzler 04/12/17, 6:45 PM

## 2017-03-14 NOTE — Progress Notes (Signed)
Family Medicine Medical Student Note Daily Progress Note Intern Pager: [phone]  Patient name: Nathaniel Burgess Medical record number: [id number] Date of birth: August 28, 1940 Age: 76 y.o. Gender: male  Primary Care Provider: Chevis Pretty, FNP Consultants: None Code Status: DNR  Pt Overview and Major Events to Date:  9/4: Generalized weakness and LLE erythema, found to have new acute stroke in corona radiata and subacute SDH x 2   Assessment and Plan: Westly Knippenbergis a 76 y.o.malepresenting with weakness, left leg redness. PMH is significant for CLL, DM, HTN, 2 strokes-one as recent as May 2018 and CABG ~5 years ago per patient.  Acute L corona radiata stroke: MRI head with new acute stroke in left corona radiata and subacute tiny SDH x 2. Neuro consulted. Carotid US-1-39% stenosis bilaterally (9/5). Echo with normal wall motion, G1DD.  -neuro consulted, appreciate recs: Clopidogrel 75mg  daily -Repeat head CT (9/4) for expansion of subdural hematoma showed stable bleed - MRA of brain w/o contrast (9/4) showed a non-enlarging stable bleed without intracranial mass effect. 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 Is afebrile. LLE erythema largely resolved. Given that patient developed LLE cellulitis while on Keflex, will cover for MRSA with PO doxy and observe for continued improvement on PO antiobiotics overnight.  - PO doxy 100mg  BID for 7 day course (end date 9/11) -CBC pending  Hypertension: Home medication: Metoprolol Tartrate 25mg  tablet-Takes 1/2 pill (12.5mg ) 2x daily. Normotensive on admission. Hx of CABG and CAD (records elsewhere).  -Continue metoprolol given CAD  Diabetes Mellitus: Home medications: Glipizide 5mg  BID before a meal, Glimepiride 2mg  QID with breakfast. A1C 7.9 (goal A1c given dementia would be 8.0). -hold home  medications -CBGs AC QHS -sensitive sliding scale  Hyperlipidemia: Home medications: Atorvastatin 20mg  QID. Cholesterol 162, HDL 27, LDL 110, Triglycerides 124 -Continue Lipitor 40mg   Vitamin D deficiency: Home medication Cholecalciferol 1000 units QID -Continue home vitamin D supplementation  Dementia: Home med is Namenda. Continue this as it can have rebound tachycardia when stopped.  -Continue Namenda  FEN/GI: SLIV, Heart diet PPx: SCDs and Plavix  Disposition: Pending echo and SNF bed  Subjective:  [name] did not have any acute overnight events. Without complaints this morning. Was able to eat with little help from son. Speech improved from admission. Son with questions about how the SNF placement process works, counseled him that he can choose the facility based on the list that [facility] provided.  Objective: Temp:  [98 F (36.7 C)-98.1 F (36.7 C)] 98 F (36.7 C) (09/06 0528) Pulse Rate:  [71-79] 73 (09/06 0528) Resp:  [16-18] 18 (09/06 0528) BP: (120-151)/(51-75) 134/51 (09/06 0528) SpO2:  [94 %-98 %] 98 % (09/06 0528) Physical Exam: General: Elderly male, pleasant, in no acute distress Cardiovascular: Regular rate and rhythm, normal S1 and S2, no murmur, without rubs or gallops. DP 2+. Respiratory: Normal work of breath. Clear to auscultation bilaterally. Abdomen: Normal bowel sounds. Without tenderness to palpation. No masses. Extremities: Normal movement and tone.  Derm: Erythema of LLE is mild, mildly tender to palpation. Without rash.  Neurologic: Alert and oriented x 3. Cranial nerves grossly intact. Strength 4/5 RUE, 5/5 in all other extremities. Sensation intact in all extremities.  Laboratory:  Recent Labs Lab 03/12/17 1152 03/13/17 1237  WBC 33.6* 35.0*  HGB 12.7* 13.2  HCT 38.8* 40.5  PLT 121* 128*    Recent Labs Lab 03/12/17 1152  03/13/17 1237  NA 136 139  K 4.3 4.4  CL 103 103  CO2 25 25  BUN 21* 20  CREATININE 1.08 1.08  CALCIUM  9.0 8.6*  PROT 5.7*  --   BILITOT 1.7*  --   ALKPHOS 134*  --   ALT 22  --   AST 22  --   GLUCOSE 97 155*    Imaging/Diagnostic Tests: Ct Head Wo Contrast  Result Date: 03/13/2017 CLINICAL DATA:  Followup stroke.  Left-sided weakness. EXAM: CT HEAD WITHOUT CONTRAST TECHNIQUE: Contiguous axial images were obtained from the base of the skull through the vertex without intravenous contrast. COMPARISON:  MRI 03/12/2017.  CT 03/12/2017 FINDINGS: Brain: Generalized atrophy. Chronic small-vessel ischemic changes throughout the cerebral hemispheric deep white matter. Subacute infarctions seen in the left corona radiata by MRI not specifically differentiated by this CT. No sign of new infarction. Small amount of subdural blood adjacent to the posteromedial falx on the left as seen by previous MR, without enlargement. Small amount of subdural blood along the right convexity seen by MRI cannot be resolved by this CT. No evidence of hydrocephalus. No mass lesion. Vascular: There is atherosclerotic calcification of the major vessels at the base of the brain. Skull: Negative Sinuses/Orbits: Clear/normal Other: None significant IMPRESSION: No change by CT. Chronic small-vessel ischemic changes of the white matter. Subacute infarction left corona radiata shown by MRI difficult to specifically delineate by CT. No new finding. Small amount of subdural blood at the left posterior inferior falx and superior tentorium is unchanged. Small amount of subdural blood along the right convexity visible by MRI not visible by CT. Electronically Signed   By: Nelson Chimes M.D.   On: 03/13/2017 10:43   Ct Head Wo Contrast  Result Date: 03/12/2017 CLINICAL DATA:  Focal neuro deficit.  Generalized weakness today. EXAM: CT HEAD WITHOUT CONTRAST TECHNIQUE: Contiguous axial images were obtained from the base of the skull through the vertex without intravenous contrast. COMPARISON:  None. FINDINGS: Brain: No evidence of acute infarction,  hemorrhage, hydrocephalus, extra-axial collection or mass lesion/mass effect. Generalized atrophy, moderate for age. Chronic microvascular ischemia in the cerebral white matter including remote small vessel infarct in the left corona radiata. Vascular: Atherosclerotic calcification.  No hyperdense vessel. Skull: No acute or aggressive finding Sinuses/Orbits: No acute finding Other: There is a 12 mm (craniocaudal) nodule in the right parotid which could be mass or lymph node. IMPRESSION: 1. No acute finding. 2. Moderate atrophy and chronic microvascular ischemia. 3. 12 mm nodule in the right parotid that could be lymph node or mass. Per chart the patient has CLL, and this could be related. Electronically Signed   By: Monte Fantasia M.D.   On: 03/12/2017 12:31   Mr Virgel Paling FK Contrast  Result Date: 03/13/2017 CLINICAL DATA:  76 year old male with ataxia and confusion found to have acute on chronic ischemia in the left corona radiata and small bilateral subdural hematomas on brain MRI yesterday. EXAM: MRA HEAD WITHOUT CONTRAST TECHNIQUE: Angiographic images of the Circle of Willis were obtained using MRA technique without intravenous contrast. COMPARISON:  Brain MRI 03/12/2017. FINDINGS: Trace bilateral subdural hematomas appear stable. No intracranial mass effect. Antegrade flow in the posterior circulation with dominant distal right vertebral artery which primarily supplies the basilar. No distal left vertebral artery stenosis identified, the vessel functionally terminates in the left PICA origin which remains patent. Dominant appearing right AICA origin is patent. Basilar artery irregularity without stenosis. SCA and PCA origins are normal.  Posterior communicating arteries are diminutive or absent. Bilateral PCA branches are patent with mild irregularity. Antegrade flow in both ICA siphons. Mild siphon ectasia without stenosis. Normal ophthalmic artery origins. Patent carotid termini. Normal MCA and ACA  origins. Anterior communicating artery and visible ACA branches are normal. Right MCA M1 segment, bifurcation, and visible right MCA branches are normal. Left MCA M1 segment and bifurcation are patent. Visible left MCA branches are within normal limits. IMPRESSION: 1. No circle of Willis branch occlusion identified. No hemodynamically significant large vessel or proximal arterial stenosis. 2. Posterior circulation atherosclerosis affecting the basilar artery and distal PCA branches without significant stenosis. 3. Trace bilateral subdural hematomas are again evident and appear stable since yesterday. Electronically Signed   By: Genevie Ann M.D.   On: 03/13/2017 11:07   Mr Brain Wo Contrast  Result Date: 03/12/2017 CLINICAL DATA:  Ataxia. Increased confusion. History of stroke with residual right leg weakness. Frequent falls. EXAM: MRI HEAD WITHOUT CONTRAST TECHNIQUE: Multiplanar, multiecho pulse sequences of the brain and surrounding structures were obtained without intravenous contrast. COMPARISON:  Head CT 03/12/2017 FINDINGS: Brain: There is a chronic lacunar infarct posteriorly in the left corona radiata. A small amount of restricted diffusion at this level is consistent with a superimposed small acute infarct, predominantly anteriorly to the chronic infarct. There is trace subdural blood in the left occipital region partially extending along the tentorium. A trace amount of focal subdural blood is noted over the right temporoparietal convexity. There is no associated mass effect. There is moderate cerebral atrophy. A thin rim of confluent periventricular white matter T2 hyperintensity and scattered small subcortical white matter foci are nonspecific but compatible with mild chronic small vessel ischemic disease. A subcentimeter T2 hyperintense focus in the inferomedial right thalamus/ superior cerebral peduncle is without significant surrounding gliosis and more suggestive of a dilated perivascular space rather  than chronic lacunar infarct. No mass or midline shift is seen. Vascular: Major intracranial vascular flow voids are preserved. Skull and upper cervical spine: No suspicious marrow lesion. Sinuses/Orbits: Unremarkable orbits.  No significant sinus disease. Other: 13 mm superficial right parotid nodule as reported on CT. IMPRESSION: 1. Acute on chronic ischemia in the left corona radiata. 2. Tiny bilateral subdural hematomas.  No mass effect. 3. Mild chronic small vessel ischemic disease and moderate cerebral atrophy. Critical Value/emergent results were called by telephone at the time of interpretation on 03/12/2017 at 7:41 pm to Dr. Garlan Fillers, who verbally acknowledged these results. Electronically Signed   By: Logan Bores M.D.   On: 03/12/2017 19:51   Dg Chest Port 1 View  Result Date: 03/12/2017 CLINICAL DATA:  Weakness and dizziness today. EXAM: PORTABLE CHEST 1 VIEW COMPARISON:  None. FINDINGS: The heart is normal in size. Mild tortuosity and calcification of the thoracic aorta. The pulmonary hila appear normal. Surgical changes from bypass surgery are noted. Mild eventration of the right hemidiaphragm. The lungs are clear. No infiltrates or effusions. The bony thorax is intact. IMPRESSION: No acute cardiopulmonary findings. Electronically Signed   By: Marijo Sanes M.D.   On: 03/12/2017 12:41     Ophelia Charter, MS4  FPTS Upper-Level Resident Addendum  I have independently interviewed and examined the patient. I have discussed the above with the original author and agree with their documentation. My edits for correction/addition/clarification are in blue. Please see also any attending notes.   Ralene Ok, MD PGY-2, [place] Service pager: [phone] (text pages welcome through [facility])

## 2017-03-14 NOTE — Progress Notes (Signed)
  Echocardiogram 2D Echocardiogram has been performed.  Jennette Dubin 03/14/2017, 9:09 AM

## 2017-03-14 NOTE — Discharge Summary (Signed)
[facility] Discharge Summary  Patient name: Nathaniel Burgess Medical record number: [id number] Date of birth: 1941-05-01 Age: 76 y.o. Gender: male Date of Admission: 03/12/2017  Date of Discharge: 03/15/17 Admitting Physician: Kinnie Feil, MD  Primary Care Provider: Chevis Pretty, FNP Consultants: neurology   Indication for Hospitalization: acute stroke, left lower extremity cellulitis  Discharge Diagnoses/Problem List:  Acute R corona radiata stroke  Cellulitis of LLE  HTN DM2 HLD Vit D Deficiency Dementia  Disposition: SNF  Discharge Condition: stale, improved  Discharge Exam:  General: Elderly male, pleasant, in no acute distress Cardiovascular: Regular rate and rhythm, normal S1 and S2, no murmur, without rubs or gallops. DP 2+. Respiratory: Normal work of breath. Clear to auscultation bilaterally. Abdomen: Normal bowel sounds. Without tenderness to palpation. No masses. Extremities: Normal movement and tone.  Derm: Erythema of LLE is very mild, tender to palpation. Without rash.  Neurologic: Alert and oriented x 3. However, did not know why he was here and when he arrived. Cranial nerves grossly intact. Strength  4/5 in RUE otherwise 5/5 in all extremities. Sensation intact in all extremities. Was able to recall 2/3 items after 3 minutes.  Brief Hospital Course:  Mr. Sahan Pen is a 76 yo male whom presented to the ED with generalized weakness, slurred speech and confusion. He has a PMH of 2 CVAs (most recent May 2018) with right sided residual deficits and residual slurred speech, CLL, DM, HTN and a CABG 5 years ago. Son was with him at bedside. Says that on presentation, his father could not stand or ambulate with his walker, notes that this is unusual as he is normally able to stand and walk on his own. Concurrent with this presentation, he also complained of new left lower leg redness. He was on day 10 for right arm cellulitis  treatment with Keflex. In the ED an Initial head CT scan did not show any acute event but did show an incidental 82mm nodule in the right parotid. Placed on IV Vanc. Did not receive IV tPA due to arriving outside of time window.  Due to acute onset of symptoms and history of CVA, workup for stroke was conducted. Head MRI revealed an acute on chronic ischemia in left corona radiata with tiny bilateral subdural hematoma in setting of CLL. Repeat head CT showed a stable bleed without expansion. Head MRA showed no LVO or high grade stenosis and stable bleed. Carotid doppler US showed 1-39% bilateral ICA stenosis. Echocardiogram: normal systolic function, normal LV size, mild LVH, G1DD, Mild Aortic stenosis and mild aortic regurgitation, mild dilation of aortic root, mild to moderate mitral valve regurgitation. PT/OT recommended placement into SNF. Risk stratification Labs: A1C 7.9, TSH 2.207, Cholesterol 162, HDL 27, LDL 110, Triglycerides 124, Mg 1.9, B12 370. Patient was started on Plavix 75mg  daily (stopped ASA).   LLE cellulitis-he was initially placed on IV Vanc. Next morning there was significant improvement. Patient's antibiotics were deescalated from IV Vanc to PO Doxycycline. Has progressively improved each day. CBC did show an elevated WBC; however, he has CLL. Last day of treatment with Doxycycline will be on 03/19/17.  For his hyperlipidemia his home medication was Atorvastatin 20mg . Atorvastatin was increased to 40mg  daily.  Continued home vitamin D supplementation, Cholecalciferol 1000 units QID.  Continued home Namenda for dementia.  Patient is being discharged to SNF, [facility].   Issues for Follow Up:  1. Follow up CBGs. Patient's home medications included Glipizide  5mg  BID before a meal and Glyburide 2mg  with breakfast. Not clear why he is on two different sulfonylureas. Only resumed Glipizide. Monitor cbgs.   Significant Procedures: none  Significant Labs  and Imaging:   Recent Labs Lab 03/12/17 1152 03/12/17 2125 03/13/17 1237 03/15/17 0507  WBC 33.6*  --  35.0* 30.8*  HGB 12.7*  --  13.2 12.3*  HCT 38.8* 36.8* 40.5 37.9*  PLT 121*  --  128* 124*    Recent Labs Lab 03/12/17 1152 03/12/17 2125 03/13/17 1237  NA 136  --  139  K 4.3  --  4.4  CL 103  --  103  CO2 25  --  25  GLUCOSE 97  --  155*  BUN 21*  --  20  CREATININE 1.08  --  1.08  CALCIUM 9.0  --  8.6*  MG  --  1.9  --   ALKPHOS 134*  --   --   AST 22  --   --   ALT 22  --   --   ALBUMIN 3.7  --   --    Troponin negative x 3  Lipid: cholesterol 162, TAG 124, HDL 27, LDL 110 .  Folate 305 Magnesium: 1.9 Vitamin B12: 370  Hemoblobin a1c 7.9 TSH: 2.207 Urinalysis: negative for infection   CT Head: 9/6  IMPRESSION: 1. The acute on chronic ischemia in the left corona radiata was better assessed on recent MRI. There is no interval change in this region today. 2. The subdural blood along the posterior inferior falx and superior left tentorium is stable. No other hemorrhage seen on CT imaging. Specifically, the small amount of blood seen at the right convexity on recent MRI is not well assessed with CT imaging.  MRI 9/5: IMPRESSION: 1. No circle of Willis branch occlusion identified. No hemodynamically significant large vessel or proximal arterial stenosis. 2. Posterior circulation atherosclerosis affecting the basilar artery and distal PCA branches without significant stenosis. 3. Trace bilateral subdural hematomas are again evident and appear stable since yesterday.  CT Head 9/5: IMPRESSION: No change by CT. Chronic small-vessel ischemic changes of the white matter. Subacute infarction left corona radiata shown by MRI difficult to specifically delineate by CT. No new finding.  Small amount of subdural blood at the left posterior inferior falx and superior tentorium is unchanged. Small amount of subdural blood along the right convexity visible  by MRI not visible by CT.    Results/Tests Pending at Time of Discharge:   Discharge Medications:  Allergies as of 03/15/2017      Reactions   Amoxicillin Rash      Medication List    STOP taking these medications   aspirin 325 MG EC tablet   cephALEXin 500 MG capsule Commonly known as:  KEFLEX   glimepiride 2 MG tablet Commonly known as:  AMARYL     TAKE these medications   atorvastatin 40 MG tablet Commonly known as:  LIPITOR Take 1 tablet (40 mg total) by mouth daily. What changed:  medication strength  how much to take   clopidogrel 75 MG tablet Commonly known as:  PLAVIX Take 1 tablet (75 mg total) by mouth daily.   doxycycline 100 MG tablet Commonly known as:  VIBRA-TABS Take 1 tablet (100 mg total) by mouth every 12 (twelve) hours.   glipiZIDE 5 MG tablet Commonly known as:  GLUCOTROL Take by mouth 2 (two) times daily before a meal.   memantine 5 MG tablet Commonly known as:  NAMENDA Take 5 mg by mouth 2 (two) times daily.   metoprolol tartrate 25 MG tablet Commonly known as:  LOPRESSOR Take 12.5 mg by mouth 2 (two) times daily.   Vitamin D3 1000 units Caps Take 1 capsule by mouth daily.            Discharge Care Instructions        Start     Ordered   03/16/17 0000  clopidogrel (PLAVIX) 75 MG tablet  Daily     03/15/17 1326   03/15/17 0000  atorvastatin (LIPITOR) 40 MG tablet  Daily     03/15/17 1326   03/15/17 0000  doxycycline (VIBRA-TABS) 100 MG tablet  Every 12 hours     03/15/17 1326      Discharge Instructions: Please refer to Patient Instructions section of EMR for full details.  Patient was counseled important signs and symptoms that should prompt return to medical care, changes in medications, dietary instructions, activity restrictions, and follow up appointments.   Follow-Up Appointments: Contact information for after-discharge care    Destination    [facility] .   Specialty:  [place] information: [address] [place] [phone]              Smiley Houseman, MD 03/15/2017, 3:09 PM

## 2017-03-15 LAB — GLUCOSE, CAPILLARY
GLUCOSE-CAPILLARY: 178 mg/dL — AB (ref 65–99)
Glucose-Capillary: 234 mg/dL — ABNORMAL HIGH (ref 65–99)

## 2017-03-15 LAB — CBC
HEMATOCRIT: 37.9 % — AB (ref 39.0–52.0)
HEMOGLOBIN: 12.3 g/dL — AB (ref 13.0–17.0)
MCH: 27.8 pg (ref 26.0–34.0)
MCHC: 32.5 g/dL (ref 30.0–36.0)
MCV: 85.7 fL (ref 78.0–100.0)
Platelets: 124 10*3/uL — ABNORMAL LOW (ref 150–400)
RBC: 4.42 MIL/uL (ref 4.22–5.81)
RDW: 14.9 % (ref 11.5–15.5)
WBC: 30.8 10*3/uL — ABNORMAL HIGH (ref 4.0–10.5)

## 2017-03-15 MED ORDER — DOXYCYCLINE HYCLATE 100 MG PO TABS: 100.0000 mg | ORAL_TABLET | Freq: Two times a day (BID) | ORAL | 0 refills | Status: AC

## 2017-03-15 MED ORDER — CLOPIDOGREL BISULFATE 75 MG PO TABS: 75.0000 mg | ORAL_TABLET | Freq: Every day | ORAL | Status: AC

## 2017-03-15 MED ORDER — ATORVASTATIN CALCIUM 40 MG PO TABS: 40.0000 mg | ORAL_TABLET | Freq: Every day | ORAL | Status: AC

## 2017-03-15 NOTE — Progress Notes (Signed)
Family Medicine Medical Student Note Daily Progress Note Intern Pager: [phone]  Patient name: Nathaniel Burgess Medical record number: [id number] Date of birth: [birth year] Age: 76 y.o. Gender: male  Primary Care Provider: Chevis Pretty, FNP Consultants: None Code Status: DNR  Pt Overview and Major Events to Date:  9/4: Generalized weakness and LLE erythema, found to have new acute stroke in corona radiata and subacute SDH x 2   Assessment and Plan: Tajh Knippenbergis a 76 y.o.malepresenting with weakness, left leg redness. PMH is significant for CLL, DM, HTN, 2 strokes-one as recent as May 2018 and CABG ~5 years ago per patient.  Acute L corona radiata stroke: MRI head with new acute stroke in left corona radiata and subacute tiny SDH x 2. Neuro consulted. Carotid US-1-39% stenosis bilaterally (9/5). Echo with normal wall motion, G1DD.  -neuro consulted, appreciate recs: Clopidogrel 75mg  daily -Repeat head CT (9/4) for expansion of subdural hematoma showed stable bleed - MRA of brain w/o contrast (9/4) showed a non-enlarging stable bleed without intracranial mass effect. No LVO or high grade stenosis -Neuro checks  -Telemetry -PT/OT/Speech consult: SNF - Repeat CT scan (3rd) showed stable bleed, no acute changes  Cellulitis of LLE, improved: Leukocytosis, 33.6 on admission>35.0>30.8, but does have CLL. Is afebrile. LLE erythema largely resolved. Given that patient developed LLE cellulitis while on Keflex, will cover for MRSA with PO doxy and observe for continued improvement on PO antiobiotics overnight.  - PO doxy 100mg  BID for 7 day course (end date 9/11) -CBC pending  Hypertension: Home medication: Metoprolol Tartrate 25mg  tablet-Takes 1/2 pill (12.5mg ) 2x daily. Normotensive on admission. Hx of CABG and CAD (records elsewhere).  -Continue metoprolol given CAD  Diabetes Mellitus: Home medications: Glipizide 5mg  BID before a meal, Glimepiride 2mg  QID with breakfast.  A1C 7.9 (goal A1c given dementia would be 8.0). -hold home medications -CBGs AC QHS -sensitive sliding scale  Hyperlipidemia: Home medications: Atorvastatin 20mg  QID. Cholesterol 162, HDL 27, LDL 110, Triglycerides 124 -Continue Lipitor 40mg   Vitamin D deficiency: Home medication Cholecalciferol 1000 units QID -Continue home vitamin D supplementation  Dementia: Home med is Namenda. Continue this as it can have rebound tachycardia when stopped.  -Continue Namenda  FEN/GI: SLIV, Heart diet PPx: SCDs and Plavix  Disposition: Pending SNF bed  Subjective:  [name] did not have any acute overnight events. This morning, son was concerned about increased confusion in comparison to previous 2 days of [name] and also his blood sugar levels. No other acute complaints this morning.  Objective: Temp:  [97.6 F (36.4 C)-99 F (37.2 C)] 99 F (37.2 C) (09/07 0452) Pulse Rate:  [72-115] 82 (09/07 0452) Resp:  [18] 18 (09/07 0452) BP: (128-132)/(64-74) 132/74 (09/07 0452) SpO2:  [97 %-100 %] 100 % (09/07 0452) Physical Exam: General: Elderly male, pleasant, in no acute distress Cardiovascular: Regular rate and rhythm, normal S1 and S2, no murmur, without rubs or gallops. DP 2+. Respiratory: Normal work of breath. Clear to auscultation bilaterally. Abdomen: Normal bowel sounds. Without tenderness to palpation. No masses. Extremities: Normal movement and tone.  Derm: Erythema of LLE is very mild, tender to palpation. Without rash.  Neurologic: Alert and oriented x 3. However, did not know why he was here and when he arrived. Cranial nerves grossly intact. Strength 4/5 RUE, 5/5 in all other extremities. Sensation intact in all extremities. Was able to recall 2/3 items after 3 minutes.  Laboratory:  Recent Labs Lab 03/12/17 1152 03/12/17 2125 03/13/17 1237 03/15/17 0507  WBC 33.6*  --  35.0* 30.8*  HGB 12.7*  --  13.2 12.3*  HCT 38.8* 36.8* 40.5 37.9*  PLT 121*  --   128* 124*    Recent Labs Lab 03/12/17 1152 03/13/17 1237  NA 136 139  K 4.3 4.4  CL 103 103  CO2 25 25  BUN 21* 20  CREATININE 1.08 1.08  CALCIUM 9.0 8.6*  PROT 5.7*  --   BILITOT 1.7*  --   ALKPHOS 134*  --   ALT 22  --   AST 22  --   GLUCOSE 97 155*    Imaging/Diagnostic Tests: Ct Head Wo Contrast  Result Date: 03/14/2017 CLINICAL DATA:  Follow-up subdural hemorrhage. EXAM: CT HEAD WITHOUT CONTRAST TECHNIQUE: Contiguous axial images were obtained from the base of the skull through the vertex without intravenous contrast. COMPARISON:  March 12, 2017 CT and MRI scans. March 13, 2017 CT scan. FINDINGS: Brain: A small amount of subdural blood along the posterior inferior falx and superior tentorium on the left is stable. The small amount of subdural hemorrhage at the right convexity on the previous MRI is not appreciated on CT imaging. No new subdural, epidural, or subarachnoid hemorrhage in the interval. Cerebellum, brainstem, and basal cisterns are normal. The ventricles and sulci remain prominent. White matter changes are identified. Low-attenuation in the left corona radiata correlates with the infarct on the recent MRI. No other changes are identified on today's study. No mass effect or midline shift. Vascular: Calcified atherosclerosis is seen in the intracranial carotid arteries. Skull: Normal. Negative for fracture or focal lesion. Sinuses/Orbits: No acute finding. Other: None. IMPRESSION: 1. The acute on chronic ischemia in the left corona radiata was better assessed on recent MRI. There is no interval change in this region today. 2. The subdural blood along the posterior inferior falx and superior left tentorium is stable. No other hemorrhage seen on CT imaging. Specifically, the small amount of blood seen at the right convexity on recent MRI is not well assessed with CT imaging. Electronically Signed   By: Dorise Bullion III M.D   On: 03/14/2017 17:01   Ct Head Wo  Contrast  Result Date: 03/13/2017 CLINICAL DATA:  Followup stroke.  Left-sided weakness. EXAM: CT HEAD WITHOUT CONTRAST TECHNIQUE: Contiguous axial images were obtained from the base of the skull through the vertex without intravenous contrast. COMPARISON:  MRI 03/12/2017.  CT 03/12/2017 FINDINGS: Brain: Generalized atrophy. Chronic small-vessel ischemic changes throughout the cerebral hemispheric deep white matter. Subacute infarctions seen in the left corona radiata by MRI not specifically differentiated by this CT. No sign of new infarction. Small amount of subdural blood adjacent to the posteromedial falx on the left as seen by previous MR, without enlargement. Small amount of subdural blood along the right convexity seen by MRI cannot be resolved by this CT. No evidence of hydrocephalus. No mass lesion. Vascular: There is atherosclerotic calcification of the major vessels at the base of the brain. Skull: Negative Sinuses/Orbits: Clear/normal Other: None significant IMPRESSION: No change by CT. Chronic small-vessel ischemic changes of the white matter. Subacute infarction left corona radiata shown by MRI difficult to specifically delineate by CT. No new finding. Small amount of subdural blood at the left posterior inferior falx and superior tentorium is unchanged. Small amount of subdural blood along the right convexity visible by MRI not visible by CT. Electronically Signed   By: Nelson Chimes M.D.   On: 03/13/2017 10:43   Ct Head Wo Contrast  Result Date: 03/12/2017 CLINICAL DATA:  Focal neuro deficit.  Generalized weakness today. EXAM: CT HEAD WITHOUT CONTRAST TECHNIQUE: Contiguous axial images were obtained from the base of the skull through the vertex without intravenous contrast. COMPARISON:  None. FINDINGS: Brain: No evidence of acute infarction, hemorrhage, hydrocephalus, extra-axial collection or mass lesion/mass effect. Generalized atrophy, moderate for age. Chronic microvascular ischemia in the  cerebral white matter including remote small vessel infarct in the left corona radiata. Vascular: Atherosclerotic calcification.  No hyperdense vessel. Skull: No acute or aggressive finding Sinuses/Orbits: No acute finding Other: There is a 12 mm (craniocaudal) nodule in the right parotid which could be mass or lymph node. IMPRESSION: 1. No acute finding. 2. Moderate atrophy and chronic microvascular ischemia. 3. 12 mm nodule in the right parotid that could be lymph node or mass. Per chart the patient has CLL, and this could be related. Electronically Signed   By: Monte Fantasia M.D.   On: 03/12/2017 12:31   Mr Virgel Paling DU Contrast  Result Date: 03/13/2017 CLINICAL DATA:  76 year old male with ataxia and confusion found to have acute on chronic ischemia in the left corona radiata and small bilateral subdural hematomas on brain MRI yesterday. EXAM: MRA HEAD WITHOUT CONTRAST TECHNIQUE: Angiographic images of the Circle of Willis were obtained using MRA technique without intravenous contrast. COMPARISON:  Brain MRI 03/12/2017. FINDINGS: Trace bilateral subdural hematomas appear stable. No intracranial mass effect. Antegrade flow in the posterior circulation with dominant distal right vertebral artery which primarily supplies the basilar. No distal left vertebral artery stenosis identified, the vessel functionally terminates in the left PICA origin which remains patent. Dominant appearing right AICA origin is patent. Basilar artery irregularity without stenosis. SCA and PCA origins are normal. Posterior communicating arteries are diminutive or absent. Bilateral PCA branches are patent with mild irregularity. Antegrade flow in both ICA siphons. Mild siphon ectasia without stenosis. Normal ophthalmic artery origins. Patent carotid termini. Normal MCA and ACA origins. Anterior communicating artery and visible ACA branches are normal. Right MCA M1 segment, bifurcation, and visible right MCA branches are normal. Left MCA  M1 segment and bifurcation are patent. Visible left MCA branches are within normal limits. IMPRESSION: 1. No circle of Willis branch occlusion identified. No hemodynamically significant large vessel or proximal arterial stenosis. 2. Posterior circulation atherosclerosis affecting the basilar artery and distal PCA branches without significant stenosis. 3. Trace bilateral subdural hematomas are again evident and appear stable since yesterday. Electronically Signed   By: Genevie Ann M.D.   On: 03/13/2017 11:07   Mr Brain Wo Contrast  Result Date: 03/12/2017 CLINICAL DATA:  Ataxia. Increased confusion. History of stroke with residual right leg weakness. Frequent falls. EXAM: MRI HEAD WITHOUT CONTRAST TECHNIQUE: Multiplanar, multiecho pulse sequences of the brain and surrounding structures were obtained without intravenous contrast. COMPARISON:  Head CT 03/12/2017 FINDINGS: Brain: There is a chronic lacunar infarct posteriorly in the left corona radiata. A small amount of restricted diffusion at this level is consistent with a superimposed small acute infarct, predominantly anteriorly to the chronic infarct. There is trace subdural blood in the left occipital region partially extending along the tentorium. A trace amount of focal subdural blood is noted over the right temporoparietal convexity. There is no associated mass effect. There is moderate cerebral atrophy. A thin rim of confluent periventricular white matter T2 hyperintensity and scattered small subcortical white matter foci are nonspecific but compatible with mild chronic small vessel ischemic disease. A subcentimeter T2 hyperintense focus in the inferomedial right thalamus/ superior cerebral peduncle is without  significant surrounding gliosis and more suggestive of a dilated perivascular space rather than chronic lacunar infarct. No mass or midline shift is seen. Vascular: Major intracranial vascular flow voids are preserved. Skull and upper cervical spine: No  suspicious marrow lesion. Sinuses/Orbits: Unremarkable orbits.  No significant sinus disease. Other: 13 mm superficial right parotid nodule as reported on CT. IMPRESSION: 1. Acute on chronic ischemia in the left corona radiata. 2. Tiny bilateral subdural hematomas.  No mass effect. 3. Mild chronic small vessel ischemic disease and moderate cerebral atrophy. Critical Value/emergent results were called by telephone at the time of interpretation on 03/12/2017 at 7:41 pm to Dr. Garlan Fillers, who verbally acknowledged these results. Electronically Signed   By: Logan Bores M.D.   On: 03/12/2017 19:51   Dg Chest Port 1 View  Result Date: 03/12/2017 CLINICAL DATA:  Weakness and dizziness today. EXAM: PORTABLE CHEST 1 VIEW COMPARISON:  None. FINDINGS: The heart is normal in size. Mild tortuosity and calcification of the thoracic aorta. The pulmonary hila appear normal. Surgical changes from bypass surgery are noted. Mild eventration of the right hemidiaphragm. The lungs are clear. No infiltrates or effusions. The bony thorax is intact. IMPRESSION: No acute cardiopulmonary findings. Electronically Signed   By: Marijo Sanes M.D.   On: 03/12/2017 12:41     Ophelia Charter, MS4  ________________________________________________________________________ I agree with the medical student's note above, with notable exceptions in the assessment and plan which I have given below.   Assessment and Plan: Brit Knippenbergis a 76 y.o.malepresenting with weakness, left leg redness. PMH is significant for CLL, DM, HTN, 2 strokes-one as recent as May 2018 and CABG ~5 years ago per patient.  Acute Lcorona radiata stroke: MRI head with new acute stroke in left corona radiata and subacute tiny SDH x 2.  -neuro consulted, appreciate recs: Clopidogrel 75mg  daily -Repeat head CT showed stable SDH -PT/OT/Speech consult: SNF - Lipitor 40mg  daily   Cellulitis of LLE, improved: - PO doxy 100mg  BID for7 day course (end date  9/11)  Hypertension: Home medication: Metoprolol Tartrate 25mg  tablet-Takes 1/2 pill (12.5mg ) 2x daily. Normotensive on admission. Hx of CABG and CAD (records elsewhere).  -Continue metoprolol given CAD  Diabetes Mellitus:  -hold home medications -CBGs AC QHS -sensitive sliding scale  Hyperlipidemia:Home medications: Atorvastatin 20mg  QID. Cholesterol 162, HDL 27, LDL 110, Triglycerides 124 -ContinueLipitor 40mg   Vitamin D deficiency: Home medication Cholecalciferol 1000 units QID -Continue home vitamin D supplementation  Dementia: Home med is Namenda. Continue this as it can have rebound tachycardia when stopped.  -Continue Namenda  Disposition: to SNF today   FEN/GI: SLIV, Heart diet PPx: SCDs and Plavix   Physical Exam: General: Elderly male, pleasant, in no acute distress Cardiovascular: Regular rate and rhythm, normal S1 and S2 Respiratory: Normal work of breath. Clear to auscultation bilaterally. Abdomen: Normal bowel sounds. Without tenderness to palpation. No masses. Extremities: Normal movement and tone.  Derm: Erythema of LLE has improved.  Neurologic: Alert and oriented x 3. Cranial nerves intact. Strength 4/5 RUE, 5/5 in all other extremities. Sensation intact in all extremities. Finger to nose testing normal.    Onnie Boer, MD Family Medicine, PGY 3 03/15/2017 9:43 AM

## 2017-03-15 NOTE — Progress Notes (Signed)
[facility] to be D/C'd Skilled nursing facility per MD order.  Discussed with the patient and all questions fully answered.  Allergies as of 03/15/2017      Reactions   Amoxicillin Rash      Medication List    STOP taking these medications   aspirin 325 MG EC tablet   cephALEXin 500 MG capsule Commonly known as:  KEFLEX   glimepiride 2 MG tablet Commonly known as:  AMARYL     TAKE these medications   atorvastatin 40 MG tablet Commonly known as:  LIPITOR Take 1 tablet (40 mg total) by mouth daily. What changed:  medication strength  how much to take   clopidogrel 75 MG tablet Commonly known as:  PLAVIX Take 1 tablet (75 mg total) by mouth daily.   doxycycline 100 MG tablet Commonly known as:  VIBRA-TABS Take 1 tablet (100 mg total) by mouth every 12 (twelve) hours.   glipiZIDE 5 MG tablet Commonly known as:  GLUCOTROL Take by mouth 2 (two) times daily before a meal.   memantine 5 MG tablet Commonly known as:  NAMENDA Take 5 mg by mouth 2 (two) times daily.   metoprolol tartrate 25 MG tablet Commonly known as:  LOPRESSOR Take 12.5 mg by mouth 2 (two) times daily.   Vitamin D3 1000 units Caps Take 1 capsule by mouth daily.            Discharge Care Instructions        Start     Ordered   03/16/17 0000  clopidogrel (PLAVIX) 75 MG tablet  Daily     03/15/17 1326   03/15/17 0000  atorvastatin (LIPITOR) 40 MG tablet  Daily     03/15/17 1326   03/15/17 0000  doxycycline (VIBRA-TABS) 100 MG tablet  Every 12 hours     03/15/17 1326   03/15/17 0000  Increase activity slowly     03/15/17 1513   03/15/17 0000  Diet - low sodium heart healthy     03/15/17 1513      VVS, Skin clean, dry and intact without evidence of skin break down, no evidence of skin tears noted. IV catheter discontinued intact. Site without signs and symptoms of complications. Dressing and pressure applied.  An After Visit Summary was printed and given to the patient.  D/c  education completed with patient/family including follow up instructions, medication list, d/c activities limitations if indicated, with other d/c instructions as indicated by MD - patient able to verbalize understanding, all questions fully answered.   Patient instructed to return to ED, call 911, or call MD for any changes in condition.   Patient escorted via Corey Harold, and D/C skilled nursing facility  Trey Sailors, [place] 03/15/2017 5:56 PM

## 2017-03-15 NOTE — Progress Notes (Signed)
CSW following to facilitate discharge to SNF. Patient has chosen [facility] and has bed available. CSW to follow to facilitate discharge when medically ready.  Laveda Abbe, [place] Clinical Social Worker [phone]

## 2017-03-15 NOTE — Clinical Social Work Note (Signed)
Clinical Social Worker facilitated patient discharge including contacting patient family and facility to confirm patient discharge plans.  Clinical information faxed to facility and family agreeable with plan.  CSW arranged ambulance transport via [facility] to [facility] .  RN to call [phone] for report prior to discharge.Patient will go to room 138.  Clinical Social Worker will sign off for now as social work intervention is no longer needed. Please consult [place] again if new need arises.  [place], [place]

## 2017-03-15 NOTE — Progress Notes (Signed)
STROKE TEAM PROGRESS NOTE   HISTORY OF PRESENT ILLNESS (per record) Nathaniel Burgess is a 76 y.o. male with a history of CLL, DM2, and prior CVAs who presented with generalized weakness and was felt to have left lower extremity cellulitis. He denies confusion or slurred speech. States his slurred speech is an old deficit.    LKW: Unclear  Patient was not administered IV t-PA secondary to arriving outside of the tPA treatment window. He was admitted to General neurology for further evaluation and treatment.   SUBJECTIVE (INTERVAL HISTORY) His  Son is in the room.  The pt  Had transient confusion last pm and got a CT head which showed no acute changes, Suspect he may be sundowning from mild baseline cognitive impairment OBJECTIVE Temp:  [97.5 F (36.4 C)-99 F (37.2 C)] 97.5 F (36.4 C) (09/07 1515) Pulse Rate:  [72-82] 72 (09/07 1515) Cardiac Rhythm: Sinus bradycardia (09/07 0911) Resp:  [18] 18 (09/07 1515) BP: (123-133)/(58-74) 133/59 (09/07 1515) SpO2:  [95 %-100 %] 95 % (09/07 1515)  CBC:   Recent Labs Lab 03/13/17 1237 03/15/17 0507  WBC 35.0* 30.8*  HGB 13.2 12.3*  HCT 40.5 37.9*  MCV 87.9 85.7  PLT 128* 124*    Basic Metabolic Panel:   Recent Labs Lab 03/12/17 1152 03/12/17 2125 03/13/17 1237  NA 136  --  139  K 4.3  --  4.4  CL 103  --  103  CO2 25  --  25  GLUCOSE 97  --  155*  BUN 21*  --  20  CREATININE 1.08  --  1.08  CALCIUM 9.0  --  8.6*  MG  --  1.9  --     Lipid Panel:     Component Value Date/Time   CHOL 162 03/12/2017 2125   TRIG 124 03/12/2017 2125   HDL 27 (L) 03/12/2017 2125   CHOLHDL 6.0 03/12/2017 2125   VLDL 25 03/12/2017 2125   LDLCALC 110 (H) 03/12/2017 2125   HgbA1c:  Lab Results  Component Value Date   HGBA1C 7.9 (H) 03/12/2017   Urine Drug Screen: No results found for: LABOPIA, COCAINSCRNUR, LABBENZ, AMPHETMU, THCU, LABBARB  Alcohol Level No results found for: [place] CT Head 03/14/17 1. The acute on chronic  ischemia in the left corona radiata was better assessed on recent MRI. There is no interval change in this region today. 2. The subdural blood along the posterior inferior falx and superior left tentorium is stable. No other hemorrhage seen on CT imaging. Specifically, the small amount of blood seen at the right convexity on recent MRI is not well assessed with CT imaging.  Ct Head Wo Contrast 03/13/2017 IMPRESSION: No change by CT. Chronic small-vessel ischemic changes of the white matter. Subacute infarction left corona radiata shown by MRI difficult to specifically delineate by CT. No new finding. Small amount of subdural blood at the left posterior inferior falx and superior tentorium is unchanged. Small amount of subdural blood along the right convexity visible by MRI not visible by CT.  Ct Head Wo Contrast 03/12/2017 IMPRESSION: 1. No acute finding. 2. Moderate atrophy and chronic microvascular ischemia. 3. 12 mm nodule in the right parotid that could be lymph node or mass. Per chart the patient has CLL, and this could be related.  [name] Head Wo Contrast 03/13/2017 IMPRESSION: 1. No circle of Willis branch occlusion identified. No hemodynamically significant large vessel or proximal arterial stenosis. 2. Posterior circulation atherosclerosis affecting the basilar artery  and distal PCA branches without significant stenosis. 3. Trace bilateral subdural hematomas are again evident and appear stable since yesterday.  Mr Brain Wo Contrast 03/12/2017 IMPRESSION: 1. Acute on chronic ischemia in the left corona radiata. 2. Tiny bilateral subdural hematomas.  No mass effect. 3. Mild chronic small vessel ischemic disease and moderate cerebral atrophy.  Dg Chest Port 1 View 03/12/2017 IMPRESSION: No acute cardiopulmonary findings.   Carotid US 03/13/2017 B ICA 1-39% stenosis, VAs antegrade  TTE 03/13/2017 Left ventricle: The cavity size was normal. Wall thickness was   increased in a pattern of  mild LVH. Systolic function was normal.   Wall motion was normal; there were no regional wall motion   abnormalities  TEE 03/15/2017  pending    PHYSICAL EXAM Obese elderly Caucasian male not in distress. . Afebrile. Head is nontraumatic. Neck is supple without bruit.    Cardiac exam no murmur or gallop. Lungs are clear to auscultation. Distal pulses are well felt. Neurological Exam ;  Awake  Alert oriented x 3. Normal speech and language.Mildly diminished attention and recall.eye movements full without nystagmus.fundi were not visualized. Vision acuity and fields appear normal. Hearing is normal. Palatal movements are normal. Face symmetric. Tongue midline. Normal strength, tone, reflexes and coordination. Normal sensation. Gait deferred.  ASSESSMENT/PLAN [name] is a 76 y.o. male with history of CLL, DM2, and prior CVAs who presented with generalized weakness and was felt to have left lower extremity cellulitis. He denies confusion or slurred speech. States his slurred speech is an old deficit.  He did not receive IV t-PA due to arriving outside of the tPA treatment window.   Stroke:  Acute on chronic ischemia in the left corona radiata with tiny bilateral subdural hematomas in the setting of CLL.  Incidental finding of 12 mm nodule in the right parotid that could be lymph node or mass.  Resultant  No focal deficits  CT head: no acute stroke  MRI head:  Acute on chronic ischemia in the left corona radiata with tiny bilateral subdural hematomas in the setting of CLL  MRA head: no LVO or high-grade stenosis  Carotid Doppler: B ICA 1-39% stenosis, VAs antegrade 2D Echo  Left ventricle: The cavity size was normal. Wall thickness was   increased in a pattern of mild LVH. Systolic function was normal.   Wall motion was normal; there were no regional wall motion   abnormalities. Doppler parameters are consistent with abnormal    left ventricular relaxation   LDL  110  HgbA1c 7.9  SCDs for VTE prophylaxis Diet - low sodium heart healthy  aspirin 325 mg daily prior to admission, now on clopidogrel 75 mg daily  Patient counseled to be compliant with his antithrombotic medications  Ongoing aggressive stroke risk factor management  Therapy recommendations: SNF;Supervision/Assistance - 24 hour   Disposition: pending  Hyperlipidemia  Home meds: atorvastatin 20 mg PO daily  LDL 110, goal < 70  Increase to atorvastatin 40 mg PO daily  Continue statin at discharge  Diabetes  HgbA1c 7.9, goal < 7.0  Uncontrolled  Other Stroke Risk Factors  Advanced age  Hx stroke/TIA  CLL  Other Active Problems Small incidental subdural hematomas on MRI -less conspicuous on CT  Hospital day # 3 I have personally examined this patient, reviewed notes, independently viewed imaging studies, participated in medical decision making and plan of care.ROS completed by me personally and pertinent positives fully documented  I have made any additions or clarifications directly to  the above note. He has presented with clinically silent acute on chronic lacunar infarct and has failed aspirin 81 as well as 325 mg daily doses . He has CLL and tiny subdurals but is at high risk for recurrent strokes and will benefit with antiplatelet therapy as he has had more than 1 stroke. He will need close neurological monitoring on Plavix and may need repeat imaging if he complains of headaches or new focal neurological problems. Explained risk of tiny subdurals worsening on Plavix versus recurrent stroke risk of 10-15% in first year and feel he will benefit from careful trial of Plavix as he has failed aspirin twice already as well as aggressive risk factor modification. D/w patient and son who agrees with plan.Greater than 50% time during this 25 minute visit were spent on counselling and ccordination of care about his stroke, subdural and answering questions.Stroke team will sign  off Call for questions  Antony Contras, MD Medical Director [facility] Pager: [phone] 03/15/2017 10:36 PM   To contact Stroke Continuity provider, please refer to [email or web address] After hours, contact General Neurology

## 2017-03-17 LAB — CULTURE, BLOOD (ROUTINE X 2)
Culture: NO GROWTH
Culture: NO GROWTH
SPECIAL REQUESTS: ADEQUATE
Special Requests: ADEQUATE

## 2017-03-17 NOTE — Clinical Social Work Note (Signed)
Call from pt's dtr stating that she is not satisfied with SNF [facility]), reports pt not getting all of his meds. CSW offered to fax DC Summary again showing pt's meds, dtr reports that the facility has summary but aren't giving pt the correct meds. CSW encouraged dtr to discuss concerns with DON @ the facility and to speak to SW @ facility about a possible transfer to another facility if they aren't meeting her expectations. Dtr very upset and reports that we should be able to transfer pt to another facility. CSW explained process once pt is DC from hospital.  Wilburn Cornelia B. Joline Maxcy Clinical Social Work Dept Weekend Social Worker [phone] 4:35 PM

## 2017-11-06 DEATH — deceased

## 2018-06-27 IMAGING — CT CT HEAD W/O CM
4 series · 16 of 47 positions shown, 18 images · non-contrast
Comparison: March 12, 2017 CT and MRI scans. March 13, 2017
CT scan.

CLINICAL DATA: Follow-up subdural hemorrhage.

EXAM:
CT HEAD WITHOUT CONTRAST
TECHNIQUE: Contiguous axial images were obtained from the base of the skull
through the vertex without intravenous contrast.

[Series 3: head without · axial · non-contrast · 0.43mm/px · z∈[-70,+55]mm · 7 of 35 slices shown, 9 images]
[im 5/35  brain]
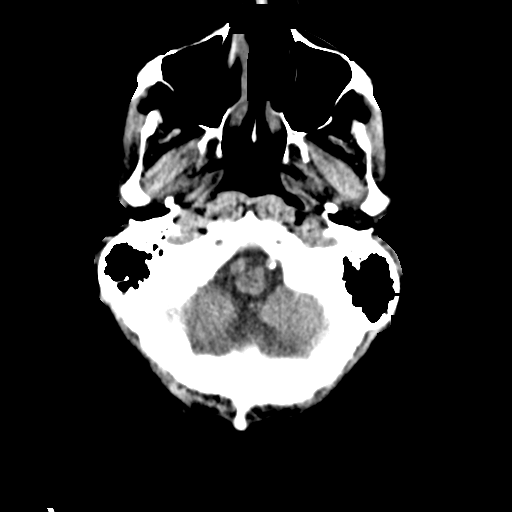
[im 5/35  bone]
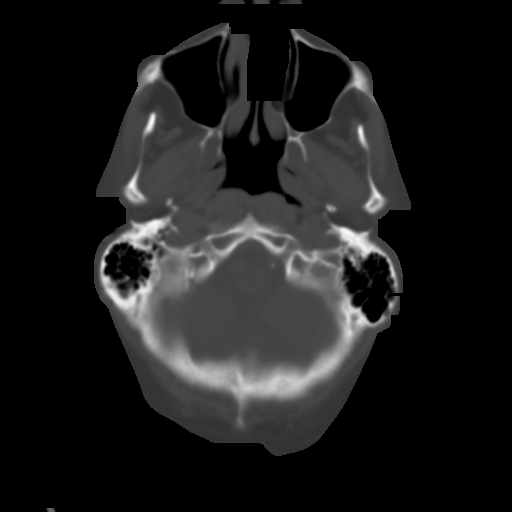
[im 9/35  brain]
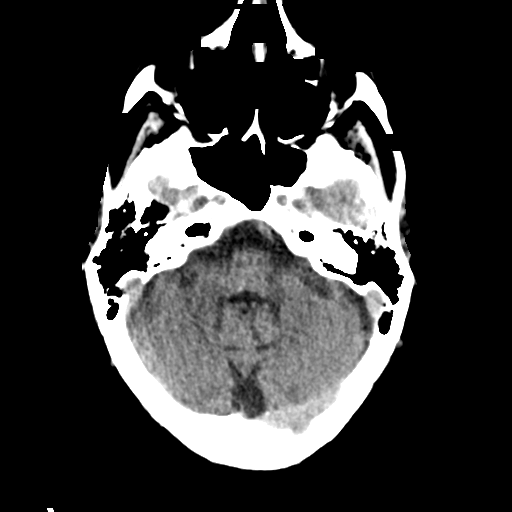
[im 13/35  brain]
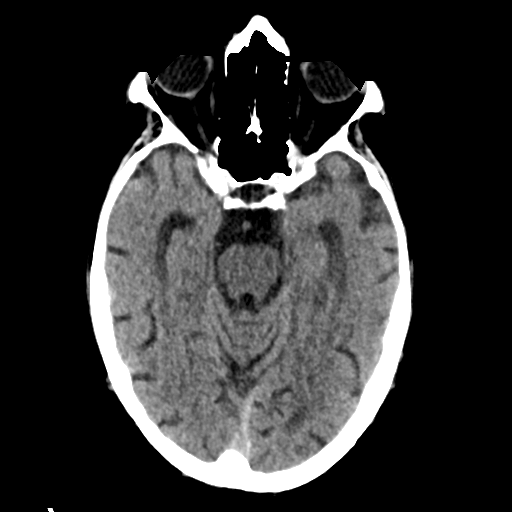
[im 18/35  brain]
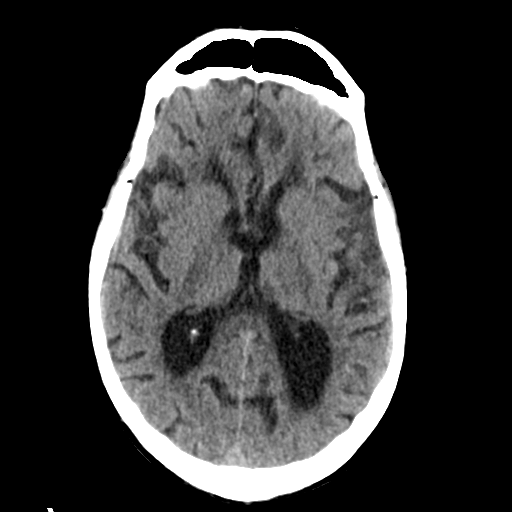
[im 22/35  brain]
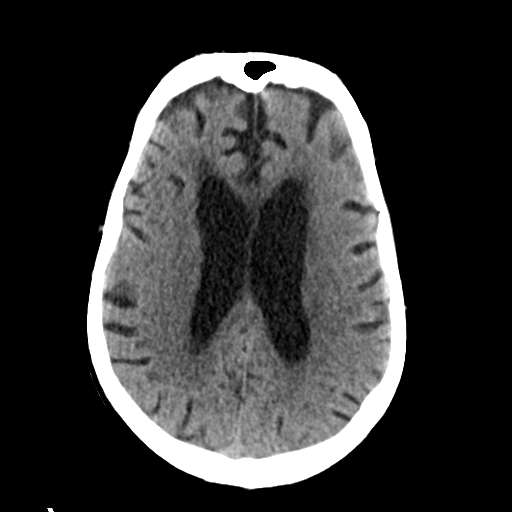
[im 22/35  bone]
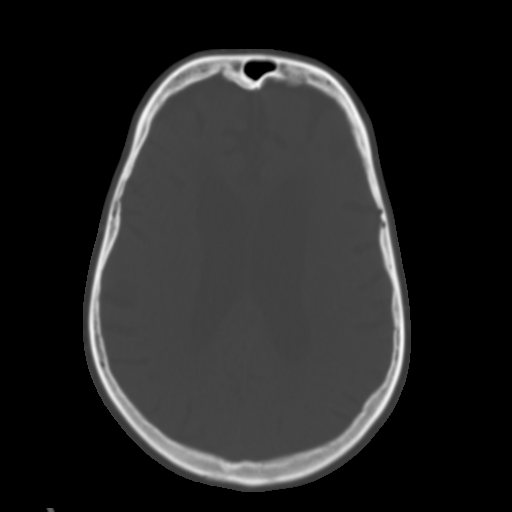
[im 26/35  brain]
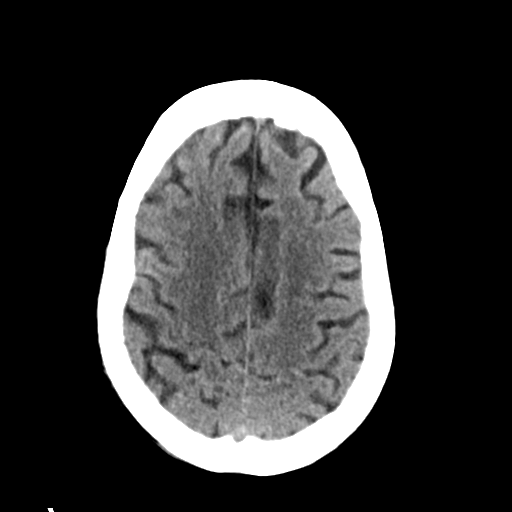
[im 30/35  brain]
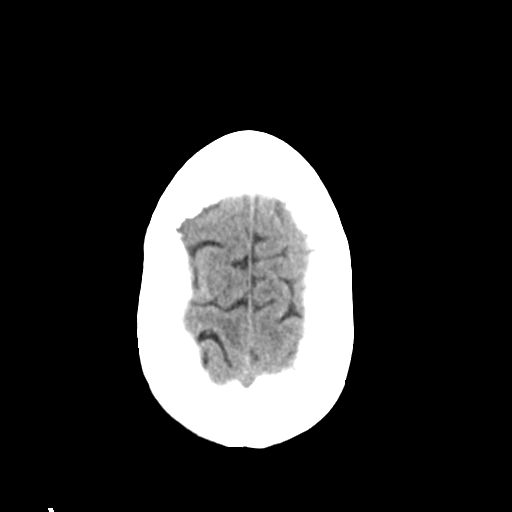

[Series 4: head bone · axial · 0.43mm/px · z∈[-74,-40]mm · 3 of 87 slices shown]
[im 9/87  bone]
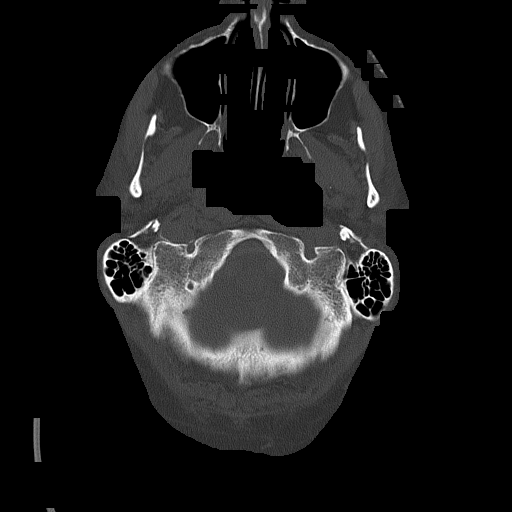
[im 18/87  bone]
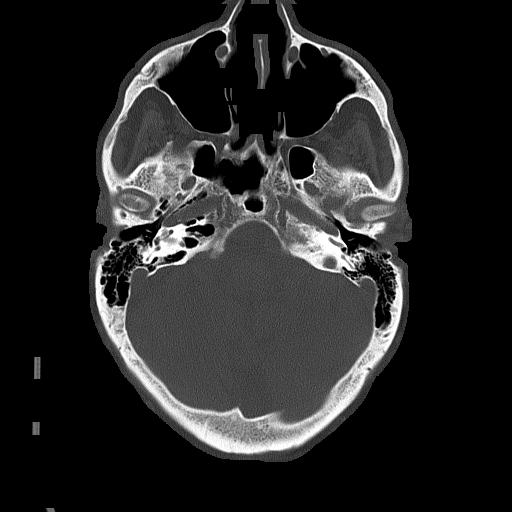
[im 26/87  bone]
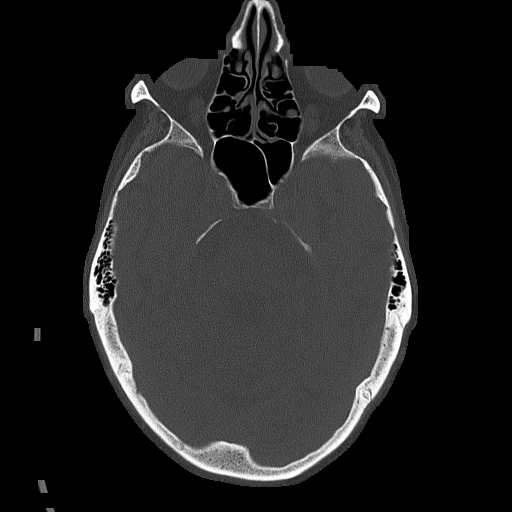

[Series 5: head without cor · coronal · non-contrast · 0.31mm/px · 3 of 72 slices shown]
[im 24/72  brain]
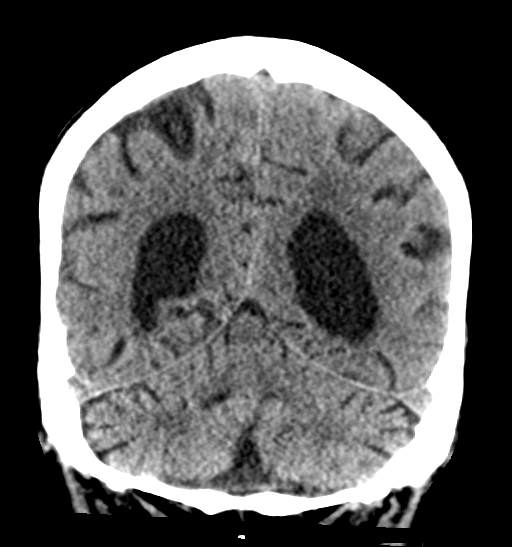
[im 32/72  brain]
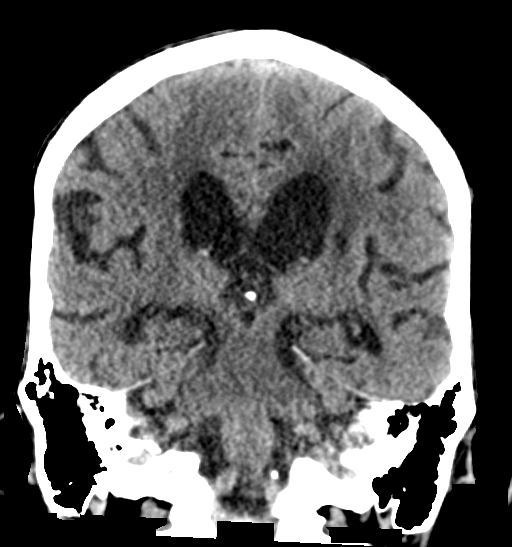
[im 40/72  brain]
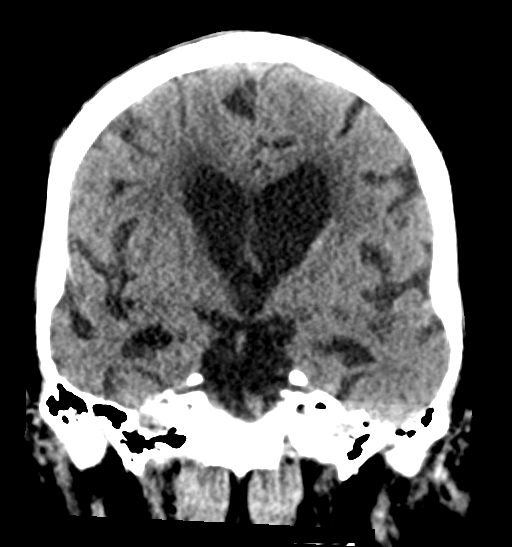

[Series 6: head without sag · sagittal · non-contrast · 0.34mm/px · 3 of 55 slices shown]
[im 19/55  brain]
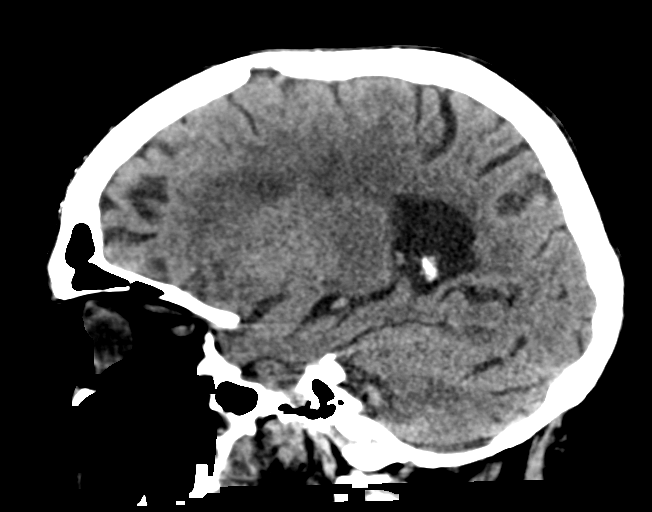
[im 28/55  brain]
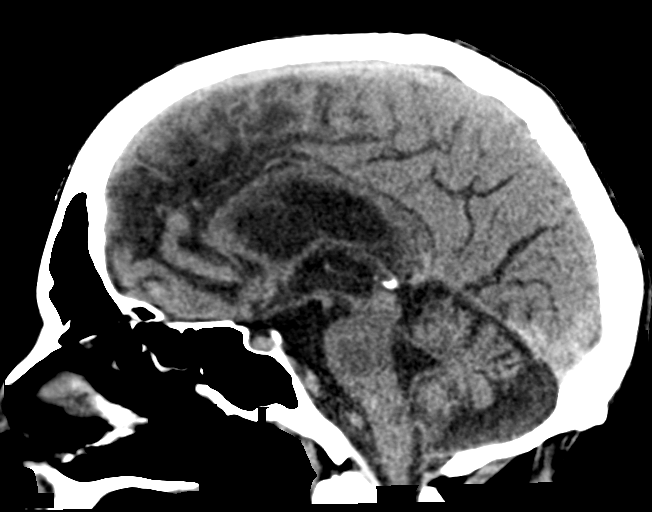
[im 37/55  brain]
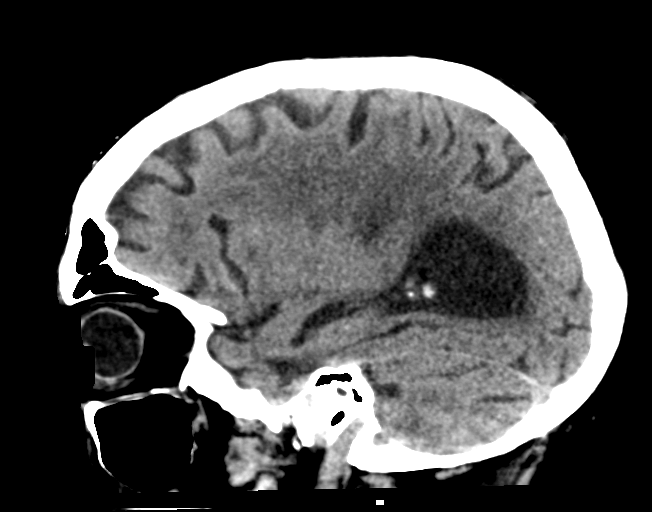

[16 of 47 positions shown; findings below may reference images not displayed]

FINDINGS: Brain: A small amount of subdural blood along the posterior inferior
falx and superior tentorium on the left is stable. The small amount
of subdural hemorrhage at the right convexity on the previous MRI is
not appreciated on CT imaging. No new subdural, epidural, or
subarachnoid hemorrhage in the interval. Cerebellum, brainstem, and
basal cisterns are normal. The ventricles and sulci remain
prominent. White matter changes are identified. Low-attenuation in
the left corona radiata correlates with the infarct on the recent
MRI. No other changes are identified on today's study. No mass
effect or midline shift.

Vascular: Calcified atherosclerosis is seen in the intracranial
carotid arteries.

Skull: Normal. Negative for fracture or focal lesion.

Sinuses/Orbits: No acute finding.

Other: None.
IMPRESSION: 1. The acute on chronic ischemia in the left corona radiata was
better assessed on recent MRI. There is no interval change in this
region today.
2. The subdural blood along the posterior inferior falx and superior
left tentorium is stable. No other hemorrhage seen on CT imaging.
Specifically, the small amount of blood seen at the right convexity
on recent MRI is not well assessed with CT imaging.
